# Patient Record
Sex: Female | Born: 1955 | Race: White | Hispanic: No | Marital: Married | Smoking: Former smoker
Health system: Southern US, Community
[De-identification: ages and names within clinical notes are randomized; demographics above are authoritative.]

## PROBLEM LIST (undated history)

## (undated) DIAGNOSIS — I341 Nonrheumatic mitral (valve) prolapse: Secondary | ICD-10-CM

## (undated) DIAGNOSIS — R002 Palpitations: Secondary | ICD-10-CM

## (undated) DIAGNOSIS — R2689 Other abnormalities of gait and mobility: Secondary | ICD-10-CM

## (undated) DIAGNOSIS — R221 Localized swelling, mass and lump, neck: Secondary | ICD-10-CM

## (undated) DIAGNOSIS — K219 Gastro-esophageal reflux disease without esophagitis: Secondary | ICD-10-CM

## (undated) DIAGNOSIS — M545 Low back pain, unspecified: Secondary | ICD-10-CM

## (undated) DIAGNOSIS — IMO0002 Reserved for concepts with insufficient information to code with codable children: Secondary | ICD-10-CM

## (undated) DIAGNOSIS — R251 Tremor, unspecified: Secondary | ICD-10-CM

## (undated) DIAGNOSIS — I10 Essential (primary) hypertension: Secondary | ICD-10-CM

## (undated) DIAGNOSIS — M62838 Other muscle spasm: Secondary | ICD-10-CM

## (undated) DIAGNOSIS — M810 Age-related osteoporosis without current pathological fracture: Secondary | ICD-10-CM

## (undated) DIAGNOSIS — F419 Anxiety disorder, unspecified: Secondary | ICD-10-CM

## (undated) DIAGNOSIS — R42 Dizziness and giddiness: Secondary | ICD-10-CM

## (undated) HISTORY — DX: Nonrheumatic mitral (valve) prolapse: I34.1

## (undated) HISTORY — DX: Tremor, unspecified: R25.1

## (undated) HISTORY — DX: Age-related osteoporosis without current pathological fracture: M81.0

## (undated) HISTORY — DX: Gastro-esophageal reflux disease without esophagitis: K21.9

## (undated) HISTORY — DX: Essential (primary) hypertension: I10

## (undated) HISTORY — PX: NASAL SINUS SURGERY: SHX719

## (undated) HISTORY — PX: COLONOSCOPY: SHX174

## (undated) HISTORY — DX: Dizziness and giddiness: R42

## (undated) HISTORY — DX: Palpitations: R00.2

## (undated) HISTORY — DX: Reserved for concepts with insufficient information to code with codable children: IMO0002

## (undated) HISTORY — DX: Low back pain, unspecified: M54.50

## (undated) HISTORY — PX: GANGLION CYST EXCISION: SHX1691

## (undated) HISTORY — DX: Anxiety disorder, unspecified: F41.9

## (undated) HISTORY — DX: Other abnormalities of gait and mobility: R26.89

## (undated) HISTORY — DX: Other muscle spasm: M62.838

---

## 1998-05-03 ENCOUNTER — Other Ambulatory Visit: Admission: RE | Admit: 1998-05-03 | Discharge: 1998-05-03 | Payer: Self-pay | Admitting: Gynecology

## 2000-10-20 ENCOUNTER — Emergency Department (HOSPITAL_COMMUNITY): Admission: EM | Admit: 2000-10-20 | Discharge: 2000-10-21 | Payer: Self-pay | Admitting: Emergency Medicine

## 2002-06-15 ENCOUNTER — Other Ambulatory Visit: Admission: RE | Admit: 2002-06-15 | Discharge: 2002-06-15 | Payer: Self-pay | Admitting: Gynecology

## 2003-06-25 ENCOUNTER — Other Ambulatory Visit: Admission: RE | Admit: 2003-06-25 | Discharge: 2003-06-25 | Payer: Self-pay | Admitting: Gynecology

## 2004-07-15 ENCOUNTER — Other Ambulatory Visit: Admission: RE | Admit: 2004-07-15 | Discharge: 2004-07-15 | Payer: Self-pay | Admitting: Gynecology

## 2005-07-17 ENCOUNTER — Other Ambulatory Visit: Admission: RE | Admit: 2005-07-17 | Discharge: 2005-07-17 | Payer: Self-pay | Admitting: Gynecology

## 2005-08-07 ENCOUNTER — Emergency Department (HOSPITAL_COMMUNITY): Admission: EM | Admit: 2005-08-07 | Discharge: 2005-08-07 | Payer: Self-pay | Admitting: Emergency Medicine

## 2006-07-19 ENCOUNTER — Other Ambulatory Visit: Admission: RE | Admit: 2006-07-19 | Discharge: 2006-07-19 | Payer: Self-pay | Admitting: Gynecology

## 2007-04-15 ENCOUNTER — Inpatient Hospital Stay (HOSPITAL_COMMUNITY): Admission: EM | Admit: 2007-04-15 | Discharge: 2007-04-16 | Payer: Self-pay | Admitting: Emergency Medicine

## 2007-07-21 ENCOUNTER — Other Ambulatory Visit: Admission: RE | Admit: 2007-07-21 | Discharge: 2007-07-21 | Payer: Self-pay | Admitting: Gynecology

## 2008-02-21 ENCOUNTER — Ambulatory Visit: Payer: Self-pay | Admitting: Gynecology

## 2008-07-31 ENCOUNTER — Encounter: Payer: Self-pay | Admitting: Gynecology

## 2008-07-31 ENCOUNTER — Other Ambulatory Visit: Admission: RE | Admit: 2008-07-31 | Discharge: 2008-07-31 | Payer: Self-pay | Admitting: Gynecology

## 2008-07-31 ENCOUNTER — Ambulatory Visit: Payer: Self-pay | Admitting: Gynecology

## 2008-09-14 ENCOUNTER — Ambulatory Visit: Payer: Self-pay | Admitting: Gynecology

## 2009-04-02 ENCOUNTER — Encounter: Admission: RE | Admit: 2009-04-02 | Discharge: 2009-04-02 | Payer: Self-pay | Admitting: Neurology

## 2009-06-20 ENCOUNTER — Encounter: Admission: RE | Admit: 2009-06-20 | Discharge: 2009-06-20 | Payer: Self-pay | Admitting: Family Medicine

## 2009-08-01 ENCOUNTER — Other Ambulatory Visit: Admission: RE | Admit: 2009-08-01 | Discharge: 2009-08-01 | Payer: Self-pay | Admitting: Gynecology

## 2009-08-01 ENCOUNTER — Ambulatory Visit: Payer: Self-pay | Admitting: Gynecology

## 2009-10-23 ENCOUNTER — Ambulatory Visit: Payer: Self-pay | Admitting: Gynecology

## 2009-11-18 ENCOUNTER — Ambulatory Visit: Payer: Self-pay | Admitting: Gynecology

## 2009-12-04 ENCOUNTER — Ambulatory Visit: Payer: Self-pay | Admitting: Gynecology

## 2010-08-04 ENCOUNTER — Encounter (INDEPENDENT_AMBULATORY_CARE_PROVIDER_SITE_OTHER): Payer: Managed Care, Other (non HMO) | Admitting: Gynecology

## 2010-08-04 ENCOUNTER — Other Ambulatory Visit: Payer: Self-pay | Admitting: Gynecology

## 2010-08-04 ENCOUNTER — Other Ambulatory Visit (HOSPITAL_COMMUNITY)
Admission: RE | Admit: 2010-08-04 | Discharge: 2010-08-04 | Disposition: A | Discharge status: Home / Self Care | Payer: Managed Care, Other (non HMO) | Source: Ambulatory Visit | Attending: Gynecology | Admitting: Gynecology

## 2010-08-04 DIAGNOSIS — Z124 Encounter for screening for malignant neoplasm of cervix: Secondary | ICD-10-CM | POA: Insufficient documentation

## 2010-08-04 DIAGNOSIS — B3731 Acute candidiasis of vulva and vagina: Secondary | ICD-10-CM

## 2010-08-04 DIAGNOSIS — Z01419 Encounter for gynecological examination (general) (routine) without abnormal findings: Secondary | ICD-10-CM

## 2010-08-04 DIAGNOSIS — B373 Candidiasis of vulva and vagina: Secondary | ICD-10-CM

## 2010-09-05 ENCOUNTER — Ambulatory Visit: Payer: Managed Care, Other (non HMO) | Admitting: Gynecology

## 2010-09-05 ENCOUNTER — Ambulatory Visit (INDEPENDENT_AMBULATORY_CARE_PROVIDER_SITE_OTHER): Payer: Managed Care, Other (non HMO) | Admitting: Gynecology

## 2010-09-05 DIAGNOSIS — B373 Candidiasis of vulva and vagina: Secondary | ICD-10-CM

## 2010-09-05 DIAGNOSIS — L293 Anogenital pruritus, unspecified: Secondary | ICD-10-CM

## 2010-09-05 DIAGNOSIS — N898 Other specified noninflammatory disorders of vagina: Secondary | ICD-10-CM

## 2010-09-05 DIAGNOSIS — N9089 Other specified noninflammatory disorders of vulva and perineum: Secondary | ICD-10-CM

## 2010-10-07 NOTE — H&P (Signed)
Sandra Morales, Sandra Morales               ACCOUNT NO.:  [id number]   MEDICAL RECORD NO.:  [id number]          PATIENT TYPE:  EMS   LOCATION:  MAJO                         FACILITY:  [facility]   PHYSICIAN:  Michelene Gardener, MD    DATE OF BIRTH:  [birth year]   DATE OF ADMISSION:  04/15/2007  DATE OF DISCHARGE:                              HISTORY & PHYSICAL   PRIMARY CARE PHYSICIAN:  Dr. Sigmund Hazel.   CHIEF COMPLAINT:  Increasing chest pain.   HISTORY OF PRESENT ILLNESS:  This is a 55 year old Caucasian female with  a past medical history of muscular spasm, presented with the above-  mentioned complaint.  The patient woke up around 4:00 in the morning  complaining of left-sided chest pain.  The patient was described as a  spasm and sometime feels pressure like.  It was around 7 to 8/10 with  some radiation to her left side of the neck.  Also complaining of  numbness over her left hand.  She was also having sweating involving her  bilateral hands and bilateral lower extremities.  Had nausea, but denied  vomiting.  She denied shortness of breath.  She had a throbbing headache  as well that started at the same time.  When she came to the ER, her  symptoms improved, but are still present.  She continued to have the  chest pain on and off and it became more like a spasm and a driving kind  of pain.  The patient admitted to having muscular spasm this fall  involving in her neck muscles and that was treated by her primary care  physician with Flexeril.  She had good results on those.  This is the  first time she experienced chest pain and did not respond to Flexeril  that she took.   PAST MEDICAL HISTORY:  Significant for muscular spasm.   MEDICATIONS:  Flexeril 10 mg q.8 hours as needed.   ALLERGIES:  TO:  1. FLAGYL.  2. PENICILLIN.  3. SULFA.  4. CODEINE.   PAST SURGICAL HISTORY:  Denied.   SOCIAL HISTORY:  The patient denied smoking.  She used to smoke a few  cigarettes when she was in  her high school and that is more than 30  years ago.  She drinks alcohol occasionally and she denies recreational  drugs.   FAMILY HISTORY:  Her father had history of hypertension and  hyperlipidemia.  Mother had history of hypertension and hyperlipidemia.  Grandfather, from mother's side, had coronary artery disease, which he  developed in his 75s and her grandmother, from her mother's side, also  had coronary artery disease and old age, in addition to heart failure.   REVIEW OF SYSTEMS:  CONSTITUTIONAL:  Positive for headache and  fatiguability.  There is no fever.  EYES:  There is no blurred vision.  ENT:  There is no problems with hearing.  There is no tinnitus.  CARDIOVASCULAR:  Postoperative for chest pain with radiation to her left  arm.  There is no shortness of breath and there is no palpitations.  No  syncope.  RESPIRATORY:  No cough.  No wheezes.  GI:  Positive for  nausea.  There is no vomiting.  There is no abdominal pain and there is  no diarrhea.  GU:  No dysuria or hematuria.  ENDOCRINE:  No polyuria.  No nocturia.  HEMATOLOGY:  No bruises.  No bleeding.  ID:  No rash.  No  lesions.  NEURO:  There is numbness in the left hand.  There is no  weakness.  There is no blurred vision.  No slurred speech.  SKIN:  There  is no rash.  The rest of systems were reviewed and they were negative.   PHYSICAL EXAMINATION:  VITAL SIGNS:  Upon arrival to the ER, her  temperature is 97.8, blood pressure is 146/79, pulse 84, respiratory  rate is 16.  GENERAL APPEARANCE:  This is a middle-aged Caucasian female, not in  acute distress.  HEENT:  Her conjunctivae are pink.  Pupils are equal and reactive to  light.  Hearing is intact.  There is no ear discharge or infection.  There is no nose infection or bleeding.  Oral mucosa is moist.  There is  no pharyngeal erythema.  NECK:  Supple.  There is no JVD.  No carotid bruits.  No  lymphadenopathy.  No thyroid enlargement or thyroid  tenderness.  CARDIOVASCULAR EXAMINATION:  S1 and S2 are regular.  There is no  murmurs.  Pulses are intact bilaterally in her radial pulse and is  regular.  RESPIRATORY EXAMINATION:  Upon inspection, the patient is breathing  between 16 to 18.  There is no use of accessory muscles.  Clear to  auscultation.  Showed no rales, no rhonchi and no wheezes.  ABDOMINAL EXAMINATION:  The abdomen is soft, nondistended, nontender.  No hepatosplenomegaly.  Bowel sounds are normal.  LOWER EXTREMITIES:  No edema.  No rash.  No varicose veins.  Pedal  pulses and tibial pulses are present and regular.  NEURO EXAM:  Just gross examination showed no neurological deficits.  Her cranial nerves are intact from II-XII.  Motor seems to be intact and  its strength is 5/5 in all 4 extremities.  Reflexes are intact.  Her  sensation is grossly intact to pain and touch sensation.   LABORATORY DATA:  WBC 5.3, hemoglobin 12.8, hematocrit 37.0, MCV 83.8,  platelets is 228.  CK-MB less than 1, troponin less than 0.05.   CT scan of the head, without contrast, showed no evidence of acute  problem.  EKG showed normal sinus rhythm with no evidence of ischemia.  There is no ST segment abnormalities and there is no T wave inversions.   ASSESSMENT/PLAN:  Chest pain.  This patient's presentation is mostly  consistent with chest pain secondary to muscular pain.  This patient is  known for muscular spasm, she got that on and off and normally relieved  with Flexeril.  On this presentation, this is the first time she had  this pain in her chest with that associated with some radiation to her  left neck and left arm.  I will observe her for 24 hours.  We will start  her on aspirin and a small dose of her metoprolol.  I will hold on ACE  inhibitors for now.  We will also put her on subcutaneous Lovenox.  I  will get cardiology to see her and to stratify her risk factors for  further evaluation for possible stress test.   Meanwhile, we will monitor  her in telemetry for any possible arrhythmias.  DISPOSITION:  Other medical problems seem to be stable at this present  time.  We will continue her outpatient medication, which is Flexeril as  needed.   Total assessment time is 50 minutes.      Michelene Gardener, MD  Electronically Signed     NAE/MEDQ  D:  04/15/2007  T:  04/15/2007  Job:  [id number]   cc:   Sigmund Hazel, M.D.

## 2010-10-07 NOTE — Consult Note (Signed)
Sandra Morales, Sandra Morales               ACCOUNT NO.:  [id number]   MEDICAL RECORD NO.:  [id number]          PATIENT TYPE:  INP   LOCATION:  3708                         FACILITY:  [facility]   PHYSICIAN:  Jake Bathe, MD      DATE OF BIRTH:  1956-05-16   DATE OF CONSULTATION:  04/15/2007  DATE OF DISCHARGE:                                 CONSULTATION   REASON FOR CONSULTATION:  Evaluation of chest pain.   HISTORY OF PRESENT ILLNESS:  A 55 year old perimenopausal female with no  prior cardiac history, but a history of migraines and muscle spasms, who  woke up this morning with chest pain.  This pain was waxing and waning  in intensity, described as a squeezing off and on, lasting a few seconds  at a time, but disconcerting associated with no shortness of breath or  diaphoresis or nausea.  She had some radiation to her left shoulder.  No  dizziness.  She has experienced muscle spasms in the past and she takes  Flexeril occasionally, but this was different and prompted her to go to  [facility] Emergency Department for further evaluation.  Currently, she  is chest pain free, she did not receive a nitroglycerin.  She is  accompanied by her husband.  Her palms and soles of feet were noticeably  sweaty this morning and she did have some chills and left arm tingling  earlier.  Otherwise, no history of syncope, orthopnea, PND, edema, or  bleeding.  Perimenopausal.   PAST MEDICAL HISTORY:  1. Muscle spasms.  2. Migraines.  3. Murmur/click diagnosed as a Archivist.  4. History of palpitations with caffeine where a Holter monitor just      showed PVCs.   PAST SURGICAL HISTORY:  She did have a cyst removed from her wrist.   ALLERGIES:  FLAGYL, PENICILLIN, SULFA, and CODEINE.   MEDICATIONS:  She takes Flexeril p.r.n.  She is not on aspirin.   SOCIAL HISTORY:  She denies any tobacco use or rare alcohol use.  No  illicit drug use.  She works as a Print production planner for a [employer].   FAMILY HISTORY:  No early family history of coronary artery disease or  sudden death.   REVIEW OF SYSTEMS:  Unless explained above, all other 12 review of  systems are negative.   PHYSICAL EXAMINATION:  VITAL SIGNS:  Blood pressure 146/79 to 136/71,  pulse 82, respirations 16, saturating 98% on room air, and temperature  97.8.  GENERAL:  Alert and oriented x3 and mildly anxious in bed.  Here with  her husband.  EYES:  Well perfused conjunctiva.  EOMI.  No scleral icterus.  HEENT:  Moist mucous membranes.  Supple neck.  No thyromegaly.  No  carotid bruits.  No JVD.  CARDIOVASCULAR:  Regular rate and rhythm with no ectopy.  No murmurs,  rubs, or gallops.  Normal PMI.  LUNGS:  Clear to auscultation bilaterally.  Normal respiratory effort.  ABDOMEN:  Soft, nontender, normoactive bowel sounds.  No bruits.  EXTREMITIES:  No clubbing, cyanosis, or edema.  Normal  pulses distally.  NEUROLOGIC:  Nonfocal.  Unable to assess gait.  SKIN:  Warm, dry, and intact.  No rashes.   LABORATORY DATA:  ECG obtained demonstrated a sinus rhythm rate 88 with  no other obvious abnormalities.  Intervals are normal.  White count 5.3,  hemoglobin 12.8, hematocrit 37.0, platelet count 228,000, and creatinine  0.6.  First set of cardiac enzymes are normal.  Chest x-ray personally  viewed demonstrates some mild thickening of the proximal airways.  Other  than that no acute issues.   ASSESSMENT AND PLAN:  A 55 year old female with prior history of  migraines and muscle spasms, here with atypical chest pain, possible  acute coronary syndrome.  Chest pain - We will monitor on telemetry, on aspirin.  We will place on  low-dose beta-blocker.  The story is quite atypical.  We will hold off  on Lovenox or heparin at this time unless enzymes are positive.  If  cardiac biomarkers and ECG remain normal, we will likely discharge the  patient and perform stress test evaluation soon in clinic.  Likely other   possible etiology to include muscle spasm, as she has had prior.  Also,  gastroesophageal reflux disease may be a possibility.  May try Protonix.  Place on low-dose aspirin.  We will check fasting lipid profile and  thyroid panel and continue to cycle cardiac enzymes.  We will closely  follow with you.  We would likely discharge in a.m. following a complete  set of negative cardiac enzymes.      Jake Bathe, MD  Electronically Signed     MCS/MEDQ  D:  04/15/2007  T:  04/16/2007  Job:  [id number]

## 2010-10-07 NOTE — Discharge Summary (Signed)
NAMEANALYSE, ANGST               ACCOUNT NO.:  [id number]   MEDICAL RECORD NO.:  [id number]          PATIENT TYPE:  INP   LOCATION:  3708                         FACILITY:  [facility]   PHYSICIAN:  Michelene Gardener, MD    DATE OF BIRTH:  November 06, 1955   DATE OF ADMISSION:  04/15/2007  DATE OF DISCHARGE:  04/16/2007                               DISCHARGE SUMMARY   PRIMARY CARE PHYSICIAN:  Sigmund Hazel, M.D.   DISCHARGE DIAGNOSES:  1. Chest pain, most likely musculoskeletal secondary to muscular      spasm.  2. Anxiety.   DISCHARGE MEDICATIONS:  1. Aspirin 81 mg p.o. once daily.  2. Metoprolol 12.5 mg p.o. twice daily.  3. Flexeril 10 mg q.8h. as needed.   CONSULTATIONS:  Cardiology consult.   PROCEDURES:  None.   FOLLOWUP APPOINTMENTS:  The patient is scheduled to have Cardiolite  stress test next Wednesday with Dr. Verdis Prime.  Followup appointment  with primary physician and with cardiology for her Cardiolite stress  test.   HOSPITAL COURSE:  This is a 55 year old female who presented to the  hospital complaining of chest pain.  This patient was admitted to the  hospital for further evaluation.  She was monitored on telemetry.  Three  sets of troponin and cardiac enzymes were done, and they came to be  normal.  Her EKG showed no evidence of acute ischemia.  This patient has  a history of muscular spasm, and it is felt that her pain secondary to  this muscular spasm.  The patient was evaluated by cardiology who  recommended to do outpatient stress test next Wednesday and to keep the  patient on aspirin and Lopressor until the final results of the stress  test.  The patient was also given Flexeril to be taken as needed.   ASSESSMENT TIME:  40 minutes.  Thank you so much.      Michelene Gardener, MD  Electronically Signed     NAE/MEDQ  D:  04/16/2007  T:  04/17/2007  Job:  [id number]   cc:   Sigmund Hazel, M.D.

## 2011-03-03 LAB — DIFFERENTIAL
Basophils Relative: 1
Eosinophils Absolute: 0.2
Eosinophils Relative: 3
Lymphs Abs: 2.3
Neutrophils Relative %: 45

## 2011-03-03 LAB — I-STAT 8, (EC8 V) (CONVERTED LAB)
BUN: 13
Chloride: 103
pCO2, Ven: 48
pH, Ven: 7.363 — ABNORMAL HIGH

## 2011-03-03 LAB — CK TOTAL AND CKMB (NOT AT ARMC)
CK, MB: 0.7
CK, MB: 0.7
Total CK: 48
Total CK: 50
Total CK: 57

## 2011-03-03 LAB — CBC
HCT: 37
MCHC: 34.5
Platelets: 228
RDW: 13.7

## 2011-03-03 LAB — LIPID PANEL
Cholesterol: 133
HDL: 46
Triglycerides: 31
VLDL: 6

## 2011-03-03 LAB — POCT CARDIAC MARKERS
CKMB, poc: <1 — ABNORMAL LOW
Myoglobin, poc: 57
Operator id: 270651

## 2011-04-30 ENCOUNTER — Encounter: Payer: Self-pay | Admitting: *Deleted

## 2011-04-30 ENCOUNTER — Emergency Department
Admission: EM | Admit: 2011-04-30 | Discharge: 2011-04-30 | Disposition: A | Discharge status: Home / Self Care | Payer: Managed Care, Other (non HMO) | Source: Home / Self Care | Attending: Emergency Medicine | Admitting: Emergency Medicine

## 2011-04-30 DIAGNOSIS — J069 Acute upper respiratory infection, unspecified: Secondary | ICD-10-CM

## 2011-04-30 HISTORY — DX: Localized swelling, mass and lump, neck: R22.1

## 2011-04-30 MED ORDER — FLUTICASONE PROPIONATE 50 MCG/ACT NA SUSP: NASAL | Status: DC

## 2011-04-30 MED ORDER — AZITHROMYCIN 250 MG PO TABS: ORAL_TABLET | ORAL | Status: AC

## 2011-04-30 NOTE — ED Provider Notes (Signed)
History     CSN: [id number] Arrival date & time: 04/30/2011  1:32 PM   First MD Initiated Contact with Patient 04/30/11 1331      Chief Complaint  Patient presents with  . Fatigue    (Consider location/radiation/quality/duration/timing/severity/associated sxs/prior treatment) HPI Comments: 5 days ago, had acute onset of loose watery stools x9 with fatigue and some myalgias. Later that day he was seen at the [facility] walk[facility]in clinic where she was diagnosed with viral gastroenteritis, given symptomatic care advice such as Pepto-Bismol and Imodium. These measures helped and her GI symptoms have now resolved. Fever and myalgias have resolved. Now complains of 2 days of sinus congestion, low-grade fever, slight discolored rhinorrhea with some ear pressure nasal stuffiness. Denies sore throat chest pain dyspnea or any productive cough. She feels fatigued but is able to tolerate by mouth clear liquids although her appetite is not back to normal yet.  The history is provided by the patient.    No past medical history on file.  No past surgical history on file.  No family history on file.  History  Substance Use Topics  . Smoking status: Not on file  . Smokeless tobacco: Not on file  . Alcohol Use: Not on file    OB History    No data available      Review of Systems  Constitutional: Positive for appetite change and fatigue.  HENT: Negative for nosebleeds and neck pain.   Eyes: Negative.  Negative for pain.  Respiratory: Negative for chest tightness, shortness of breath and wheezing.   Cardiovascular: Negative for chest pain and leg swelling.  Gastrointestinal: Negative for abdominal distention.  Genitourinary: Negative.   Musculoskeletal: Negative.   Neurological: Positive for light-headedness (Had this couple days ago but it resolved.). Negative for seizures, syncope and weakness.  Hematological: Negative.   Psychiatric/Behavioral: Negative.     Allergies  Codeine; Flagyl;  and Sulphadimidine sodium  Home Medications   Current Outpatient Rx  Name Route Sig Dispense Refill  . AZITHROMYCIN 250 MG PO TABS  Take 2 tablets on day one, then 1 tablet daily on days 2 through 5 1 each 0  . FLUTICASONE PROPIONATE 50 MCG/ACT NA SUSP  1 or 2 sprays each nostril twice a day 16 g 0    There were no vitals taken for this visit.  Physical Exam  Nursing note and vitals reviewed. Constitutional: She is oriented to person, place, and time. She appears well-developed and well-nourished. No distress.  HENT:  Head: Normocephalic and atraumatic.  Right Ear: Tympanic membrane, external ear and ear canal normal.  Left Ear: Tympanic membrane, external ear and ear canal normal.  Nose: Mucosal edema and rhinorrhea present. Right sinus exhibits maxillary sinus tenderness (Minimal). Left sinus exhibits maxillary sinus tenderness (Minimal).  Mouth/Throat: Oropharynx is clear and moist. No oral lesions. No oropharyngeal exudate.  Eyes: Right eye exhibits no discharge. Left eye exhibits no discharge. No scleral icterus.  Neck: Neck supple.  Cardiovascular: Normal rate, regular rhythm and normal heart sounds.   Pulmonary/Chest: Effort normal and breath sounds normal. She has no wheezes. She has no rales.  Lymphadenopathy:    She has no cervical adenopathy.  Neurological: She is alert and oriented to person, place, and time.  Skin: Skin is warm and dry.   she appears fatigued, but not toxic  ED Course  Procedures (including critical care time)  Labs Reviewed - No data to display No results found.   1. URI (upper respiratory infection)  MDM  Likely has a viral upper respiratory infection. It's possible that she could have had influenza 5 days ago, with some GI symptoms but those symptoms have resolved. We discussed testing options and she agrees not to do flu test as that would not change management even if she were to be positive. At her request I wrote for prescription  for Zithromax Z-Pak, but explained not to fill this unless she develops bacterial URI symptoms such as a thick purulent sputum or nasal discharge or spiking fever. She agrees. Prescribed Flonase for nasal congestion as that has helped in the past. And she does not tolerate decongestants well. She voiced understanding and agreement.        Lonell Face, MD 04/30/11 [phone]

## 2011-07-30 DIAGNOSIS — I341 Nonrheumatic mitral (valve) prolapse: Secondary | ICD-10-CM | POA: Insufficient documentation

## 2011-08-11 ENCOUNTER — Encounter: Payer: Self-pay | Admitting: Gynecology

## 2011-08-12 ENCOUNTER — Other Ambulatory Visit: Payer: Self-pay | Admitting: *Deleted

## 2011-08-12 DIAGNOSIS — N6459 Other signs and symptoms in breast: Secondary | ICD-10-CM

## 2011-08-13 ENCOUNTER — Other Ambulatory Visit: Payer: Self-pay | Admitting: Gynecology

## 2011-08-13 DIAGNOSIS — N6459 Other signs and symptoms in breast: Secondary | ICD-10-CM

## 2011-08-14 ENCOUNTER — Encounter: Payer: Self-pay | Admitting: Gynecology

## 2011-08-14 ENCOUNTER — Ambulatory Visit (INDEPENDENT_AMBULATORY_CARE_PROVIDER_SITE_OTHER): Payer: Managed Care, Other (non HMO) | Admitting: Gynecology

## 2011-08-14 VITALS — BP 144/70 | Ht 64.75 in | Wt 155.0 lb

## 2011-08-14 DIAGNOSIS — N949 Unspecified condition associated with female genital organs and menstrual cycle: Secondary | ICD-10-CM

## 2011-08-14 DIAGNOSIS — Z131 Encounter for screening for diabetes mellitus: Secondary | ICD-10-CM

## 2011-08-14 DIAGNOSIS — M8580 Other specified disorders of bone density and structure, unspecified site: Secondary | ICD-10-CM

## 2011-08-14 DIAGNOSIS — Z13220 Encounter for screening for lipoid disorders: Secondary | ICD-10-CM

## 2011-08-14 DIAGNOSIS — R635 Abnormal weight gain: Secondary | ICD-10-CM

## 2011-08-14 DIAGNOSIS — N760 Acute vaginitis: Secondary | ICD-10-CM

## 2011-08-14 DIAGNOSIS — A499 Bacterial infection, unspecified: Secondary | ICD-10-CM

## 2011-08-14 DIAGNOSIS — Z01419 Encounter for gynecological examination (general) (routine) without abnormal findings: Secondary | ICD-10-CM

## 2011-08-14 DIAGNOSIS — N898 Other specified noninflammatory disorders of vagina: Secondary | ICD-10-CM

## 2011-08-14 DIAGNOSIS — B9689 Other specified bacterial agents as the cause of diseases classified elsewhere: Secondary | ICD-10-CM

## 2011-08-14 DIAGNOSIS — M899 Disorder of bone, unspecified: Secondary | ICD-10-CM

## 2011-08-14 LAB — WET PREP FOR TRICH, YEAST, CLUE: Yeast Wet Prep HPF POC: NONE SEEN

## 2011-08-14 MED ORDER — PROGESTERONE MICRONIZED 100 MG PO CAPS: 100.0000 mg | ORAL_CAPSULE | Freq: Every day | ORAL | Status: DC

## 2011-08-14 MED ORDER — ESTRADIOL 0.05 MG/24HR TD PTTW: 1.0000 | MEDICATED_PATCH | TRANSDERMAL | Status: DC

## 2011-08-14 MED ORDER — CLINDAMYCIN PHOSPHATE 2 % VA CREA: 1.0000 | TOPICAL_CREAM | Freq: Every day | VAGINAL | Status: AC

## 2011-08-14 NOTE — Progress Notes (Signed)
Sandra Morales February 15, 1956 [id number]        56 y.o.  for annual exam.  Several issues noted below  Past medical history,surgical history, medications, allergies, family history and social history were all reviewed and documented in the EPIC chart. ROS:  Was performed and pertinent positives and negatives are included in the history.  Exam: Sherrilyn Rist chaperone present Filed Vitals:   08/14/11 1547  BP: 144/70   General appearance  Normal Skin grossly normal Head/Neck normal with no cervical or supraclavicular adenopathy thyroid normal Lungs  clear Cardiac RR, without RMG Abdominal  soft, nontender, without masses, organomegaly or hernia Breasts  examined lying and sitting without masses, retractions, discharge or axillary adenopathy. Pelvic  Ext/BUS/vagina  normal with white discharge  Cervix  normal    Uterus  axial, normal size, shape and contour, midline and mobile nontender   Adnexa  Without masses or tenderness    Anus and perineum  normal   Rectovaginal  normal sphincter tone without palpated masses or tenderness.    Assessment/Plan:  56 y.o. female for annual exam.    1. Vaginal odor. Patient complaining of a vaginal odor she does have a whitish discharge.  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 We'll treat with Cleocin vaginal cream nightly x1 week as she is allergic to Flagyl. Follow up if symptoms persist or recur. 2. Menopausal symptoms. Patient continues to have significant hot flushes and night sweats.  A friend of hers is using a compounded skin cream and she had questions about this. I reviewed the whole issue of pharmaceutical versus compounded HRT. I reviewed the WHI study with her to include the increased risk of stroke heart attack DVT and increased risk of breast cancer. She had tried CombiPatch several years ago apparently had some heart racing that she stopped the medication after being on a for a short period of time.  Alternatives such as soy based for fracture or  have been reviewed and ultimately the patient wants to try HRT again accepted the risks. I prescribed minivelle 0.05 mg patches and Prometrium 100 mg nightly and we'll see how she does with this. 3. Mammography. Patient recently had mammogram with follow up ultrasound showed some small cysts in the left breast. They recommended annual follow up. SBE monthly reviewed.  4. Osteopenia. DEXA June 2011 showed osteopenia with a T score -1.7. Repeat DEXA ordered now. Baseline vitamin D ordered. Increase calcium vitamin D reviewed. 5. Pap smear. The Pap smear was done today. Last Pap smear 2011. She has no history of significant abnormal Pap smears in the past with multiple normal reports in the chart. Her review current screening guidelines we'll plan on every three-year Pap smears. 6. Colonoscopy. Patient is up-to-date with colonoscopy with her last one in May 2011. 7. Elevated blood pressure.  Patient's blood pressure is 144/70. Apparently had been elevated at her neurologist's office also. She is not currently exercising I recommended that she really activate her exercise program follow her blood pressure and if it does remain elevated she will need to see her primary Dr. Sigmund Hazel in follow up and consider treatment.  She had been on medication intermittently for some swelling but does not take that now I discussed with her that if her blood pressure remains elevated she would need to consider alternative medication. 8. Health maintenance. Baseline CBC, compressive metabolic panel, vitamin D, lipid profile and urinalysis were ordered as she knows she has not seen Dr. Hyacinth Meeker in some time and  had not had any blood work done. Patient will follow up with me after initiating HRT and we'll see how she does.    Dara Lords MD, 5:26 PM 08/14/2011

## 2011-08-14 NOTE — Patient Instructions (Signed)
Initiate hormone replacement therapy as we discussed. Follow up with me if you have any issues or questions. Use vaginal cream follow up if symptoms persist. Continue to monitor blood pressure. It remains elevated he'll need to see Dr. Sigmund Hazel in follow up.

## 2011-08-15 LAB — URINALYSIS W MICROSCOPIC + REFLEX CULTURE
Bacteria, UA: NONE SEEN
Bilirubin Urine: NEGATIVE
Casts: NONE SEEN
Crystals: NONE SEEN
Glucose, UA: NEGATIVE mg/dL
Ketones, ur: NEGATIVE mg/dL
Nitrite: NEGATIVE
Protein, ur: NEGATIVE mg/dL
Specific Gravity, Urine: 1.015 (ref 1.005–1.030)
Squamous Epithelial / HPF: NONE SEEN
Urobilinogen, UA: 1 mg/dL (ref 0.0–1.0)
pH: 6 (ref 5.0–8.0)

## 2011-08-15 LAB — CBC WITH DIFFERENTIAL/PLATELET
HCT: 38.9 % (ref 36.0–46.0)
Hemoglobin: 12.8 g/dL (ref 12.0–15.0)
Lymphocytes Relative: 31 % (ref 12–46)
Monocytes Absolute: 0.5 10*3/uL (ref 0.1–1.0)
Monocytes Relative: 7 % (ref 3–12)
Neutro Abs: 4.3 10*3/uL (ref 1.7–7.7)
RBC: 4.53 MIL/uL (ref 3.87–5.11)
WBC: 7.3 10*3/uL (ref 4.0–10.5)

## 2011-08-15 LAB — VITAMIN D 25 HYDROXY (VIT D DEFICIENCY, FRACTURES): Vit D, 25-Hydroxy: 19 ng/mL — ABNORMAL LOW (ref 30–89)

## 2011-08-15 LAB — COMPREHENSIVE METABOLIC PANEL
AST: 19 U/L (ref 0–37)
Albumin: 4.4 g/dL (ref 3.5–5.2)
BUN: 12 mg/dL (ref 6–23)
Calcium: 9.1 mg/dL (ref 8.4–10.5)
Chloride: 106 mEq/L (ref 96–112)
Glucose, Bld: 96 mg/dL (ref 70–99)
Potassium: 3.7 mEq/L (ref 3.5–5.3)

## 2011-08-15 LAB — LIPID PANEL
HDL: 49 mg/dL (ref >39)
Triglycerides: 48 mg/dL (ref <150)

## 2011-08-16 LAB — URINE CULTURE: Colony Count: 75000

## 2011-08-17 NOTE — Progress Notes (Signed)
Addended by: Dara Lords on: 08/17/2011 08:10 AM   Modules accepted: Orders

## 2011-08-26 ENCOUNTER — Telehealth: Payer: Self-pay | Admitting: *Deleted

## 2011-08-26 NOTE — Telephone Encounter (Signed)
Pt called with questions about HRT patch and Prometrium pills all questions answered.

## 2011-09-22 ENCOUNTER — Telehealth: Payer: Self-pay | Admitting: *Deleted

## 2011-09-22 MED ORDER — CLINDAMYCIN HCL 300 MG PO CAPS: 300.0000 mg | ORAL_CAPSULE | Freq: Two times a day (BID) | ORAL | Status: AC

## 2011-09-22 NOTE — Telephone Encounter (Signed)
Pt informed with the below note. 

## 2011-09-22 NOTE — Telephone Encounter (Signed)
Let's try clindamycin 300 mg twice a day x7 days

## 2011-09-22 NOTE — Telephone Encounter (Signed)
Pt is calling c/o very light clear discharge and very light vagina odor. Pt was given Cleocin vaginal cream nightly x1 week on 08/14/11 and took all of rx. Pt doesn't think that the cream cleared the BV infection total. Can pt have rx or OV? Please advise

## 2011-10-05 ENCOUNTER — Encounter: Payer: Self-pay | Admitting: Gynecology

## 2011-10-05 ENCOUNTER — Ambulatory Visit (INDEPENDENT_AMBULATORY_CARE_PROVIDER_SITE_OTHER): Payer: Managed Care, Other (non HMO) | Admitting: Gynecology

## 2011-10-05 DIAGNOSIS — N898 Other specified noninflammatory disorders of vagina: Secondary | ICD-10-CM

## 2011-10-05 DIAGNOSIS — N949 Unspecified condition associated with female genital organs and menstrual cycle: Secondary | ICD-10-CM

## 2011-10-05 DIAGNOSIS — E559 Vitamin D deficiency, unspecified: Secondary | ICD-10-CM

## 2011-10-05 LAB — WET PREP FOR TRICH, YEAST, CLUE: Yeast Wet Prep HPF POC: NONE SEEN

## 2011-10-05 NOTE — Progress Notes (Signed)
Patient presents complaining of some vaginal wetness. She was treated at her annual exam in March for HPV with Cleocin vaginal cream. Note see vaginal odor seem to get better but still has some of discharge we have prescribed Cleocin oral but she never took it. Also has started on black cohosh for menopausal symptoms. She decided against HRT as we had discussed at her annual. Does note a lot of GI activity since starting the black cohosh.  Exam with Sherrilyn Rist chaperone present Abdomen soft nontender without masses guarding rebound organomegaly. Pelvic external BUS vagina with slight white discharge. Cervix normal. Uterus normal size midline mobile nontender. Adnexa without masses or tenderness  Assessment and plan: Exam overall normal. Wet prep is unremarkable except for some bacteria. I discussed the issues of low grade bacterial with we will go ahead and treat with oral Cleocin or not. She's more comfortable just waiting and she's not overtly symptomatic. I did discuss adding soy to see if this doesn't help with her menopausal symptoms and she's going to do so. I also reviewed her need to increase her calcium and vitamin D. Her vitamin D level was 19 and I stressed the need to get started on vitamin D supplement.

## 2011-10-05 NOTE — Patient Instructions (Signed)
Increase vitamin D and calcium as we discussed.

## 2011-11-06 ENCOUNTER — Other Ambulatory Visit: Payer: Self-pay | Admitting: *Deleted

## 2011-11-06 DIAGNOSIS — E7800 Pure hypercholesterolemia, unspecified: Secondary | ICD-10-CM

## 2011-11-12 ENCOUNTER — Other Ambulatory Visit: Payer: Managed Care, Other (non HMO)

## 2011-11-12 ENCOUNTER — Ambulatory Visit (INDEPENDENT_AMBULATORY_CARE_PROVIDER_SITE_OTHER): Payer: Managed Care, Other (non HMO)

## 2011-11-12 ENCOUNTER — Telehealth: Payer: Self-pay | Admitting: *Deleted

## 2011-11-12 DIAGNOSIS — M810 Age-related osteoporosis without current pathological fracture: Secondary | ICD-10-CM

## 2011-11-12 DIAGNOSIS — M8580 Other specified disorders of bone density and structure, unspecified site: Secondary | ICD-10-CM

## 2011-11-12 DIAGNOSIS — E7800 Pure hypercholesterolemia, unspecified: Secondary | ICD-10-CM

## 2011-11-12 LAB — LIPID PANEL
HDL: 44 mg/dL (ref >39)
Total CHOL/HDL Ratio: 3.3 Ratio
Triglycerides: 56 mg/dL (ref <150)

## 2011-11-12 NOTE — Telephone Encounter (Signed)
Pt informed with the below note. 

## 2011-11-12 NOTE — Telephone Encounter (Signed)
Pt is here this am for bone density today and would like to know if she can take OTC super calcium magnesium 800 I/U Vit. D?

## 2011-11-12 NOTE — Telephone Encounter (Signed)
okay

## 2011-11-13 LAB — VITAMIN D 25 HYDROXY (VIT D DEFICIENCY, FRACTURES): Vit D, 25-Hydroxy: 36 ng/mL (ref 30–89)

## 2011-11-17 ENCOUNTER — Encounter: Payer: Self-pay | Admitting: Gynecology

## 2011-11-17 ENCOUNTER — Ambulatory Visit (INDEPENDENT_AMBULATORY_CARE_PROVIDER_SITE_OTHER): Payer: Managed Care, Other (non HMO) | Admitting: Gynecology

## 2011-11-17 DIAGNOSIS — M810 Age-related osteoporosis without current pathological fracture: Secondary | ICD-10-CM

## 2011-11-17 NOTE — Patient Instructions (Signed)
I will call you after discussion in review of your bone density study with other physicians.

## 2011-11-17 NOTE — Progress Notes (Signed)
Patient presents for review of her bone density. Most recently had DEXA that shows T score -2.0 at the right femoral neck -2.8 at the spine considering L3-L4. On review of her spine pictures L1-L2 showed T score -0.2 and -1.6. L3-L4 show -3.3 and -2.4. The right and left hips on comparison from 2008 Baseline DEXA show no change and she has a statistically significant decrease in her AP spine when looking at L3-L4. I reviewed with her the question 2 we accept  L1 and L2 versus L3 and L4.   When all combine total was -2.0. Her vitamin D was 36. She is taking 1000 units daily with some extra calcium. The issue of treatment versus observation reviewed the options for treatment and side effect profiles as well as risks discussed to include bisphosphonates GERD, esophageal cancer, osteonecrosis of the jaw, atypical fractures particularly with prolonged use. Patient is unsure she wants to proceed with treatment versus observation at this point possible short interval follow up study in one year. She wants to think of her options and I also told her that I'm going to review her scans with other physicians to get their opinions and then will have a final discussion. Patient agrees with this plan.

## 2011-11-24 ENCOUNTER — Telehealth: Payer: Self-pay | Admitting: Gynecology

## 2011-11-24 NOTE — Telephone Encounter (Signed)
PT, NOTIFIED OF TF'S NOTE BELOW AND STATES SHE STILL HAS MORE ?'S FOR TF BEFORE STARTING THE RX. THE BEST # TO REACH HER AT IS THE [phone].

## 2011-11-24 NOTE — Telephone Encounter (Addendum)
Tell patient that I discussed bone density with some other physicians and I think that we should go ahead and start treatment. I would recommend starting with a bisphosphonate such as weekly Fosamax or monthly Actonel.  Fosamax is generic which would be cheaper. We discussed the risks and side effects at her office. I think given her younger age that treatment now would be prudent instead of waiting until there could be significant issues. She agrees with this and I will prescribe medication if she has any further questions let me know and I can talk to her.

## 2011-11-30 MED ORDER — ALENDRONATE SODIUM 70 MG PO TABS: 70.0000 mg | ORAL_TABLET | ORAL | Status: DC

## 2011-11-30 NOTE — Telephone Encounter (Signed)
Called patient on the phone. We discussed a recommendation to start a bisphosphonate. I reviewed various options and ultimately we decided on alendronate 70 mg weekly. I reviewed how to take the medication and I discussed again the side effect profile to include GERD, esophageal cancer, osteonecrosis of the jaw and atypical fractures. Patient went start this and we'll plan on repeating her bone density in 2 years.

## 2011-12-04 ENCOUNTER — Encounter: Payer: Self-pay | Admitting: Gynecology

## 2012-04-08 ENCOUNTER — Ambulatory Visit (INDEPENDENT_AMBULATORY_CARE_PROVIDER_SITE_OTHER): Payer: Managed Care, Other (non HMO) | Admitting: Gynecology

## 2012-04-08 ENCOUNTER — Encounter: Payer: Self-pay | Admitting: Gynecology

## 2012-04-08 VITALS — BP 140/80

## 2012-04-08 DIAGNOSIS — R5383 Other fatigue: Secondary | ICD-10-CM

## 2012-04-08 DIAGNOSIS — M2550 Pain in unspecified joint: Secondary | ICD-10-CM

## 2012-04-08 DIAGNOSIS — R609 Edema, unspecified: Secondary | ICD-10-CM

## 2012-04-08 NOTE — Patient Instructions (Signed)
Follow up for lab work. If lab work is all normal and your symptoms resolve after stopping the Fosamax and we'll discuss alternative treatments for your bones. If your symptoms continue then you will need to see your primary physician in follow up for further evaluation. If any of your lab tests are abnormal then will address this individually.

## 2012-04-08 NOTE — Progress Notes (Signed)
Patient presents with a number of complaints to include peripheral extremity swelling and abdominal bloating. Joint pain balled hips and finger joints. Fatigue and weight gain. She's been on Fosamax since June and wonders whether is related to this. She does note the day after taking her Fosamax weekly she has a lot of joint pain muscle weakness which resolves over the day or so.  Is on Estroven for 2 months  Exam HEENT normal without adenopathy Skin without rash or other lesions Lungs clear bilaterally Cardiac regular rate without rubs murmurs or gallops Abdomen soft active bowel sounds without masses guarding tender organomegaly Extremities without overt edema or joint deformity or swelling  Assessment and plan:  Swelling/bloating/fatigue/weight gain/arthralgias. Check baseline CBC comprehensive metabolic panel rheumatoid factor sedimentation rate ANA TSH and urinalysis. Stop Fosamax for now. If symptoms all clear and lab work normal then we'll follow. If symptoms persist she will follow up with her primary physician. We'll reassess after being off the Fosamax. Alternatives excluding bisphosphonates such as Prolia a possibility and we'll rediscuss.

## 2012-04-09 LAB — COMPREHENSIVE METABOLIC PANEL
ALT: 40 U/L — ABNORMAL HIGH (ref 0–35)
AST: 26 U/L (ref 0–37)
Alkaline Phosphatase: 73 U/L (ref 39–117)
BUN: 13 mg/dL (ref 6–23)
Calcium: 9.5 mg/dL (ref 8.4–10.5)
Creat: 0.66 mg/dL (ref 0.50–1.10)
Total Bilirubin: 0.3 mg/dL (ref 0.3–1.2)

## 2012-04-09 LAB — CBC WITH DIFFERENTIAL/PLATELET
Basophils Absolute: 0 10*3/uL (ref 0.0–0.1)
Basophils Relative: 1 % (ref 0–1)
Eosinophils Absolute: 0.2 10*3/uL (ref 0.0–0.7)
Eosinophils Relative: 3 % (ref 0–5)
HCT: 37.7 % (ref 36.0–46.0)
Lymphocytes Relative: 37 % (ref 12–46)
MCHC: 35 g/dL (ref 30.0–36.0)
MCV: 81.1 fL (ref 78.0–100.0)
Monocytes Absolute: 0.5 10*3/uL (ref 0.1–1.0)
Platelets: 238 10*3/uL (ref 150–400)
RDW: 12.3 % (ref 11.5–15.5)
WBC: 7.5 10*3/uL (ref 4.0–10.5)

## 2012-04-09 LAB — URINALYSIS W MICROSCOPIC + REFLEX CULTURE
Bacteria, UA: NONE SEEN
Casts: NONE SEEN
Crystals: NONE SEEN
Glucose, UA: NEGATIVE mg/dL
Hgb urine dipstick: NEGATIVE
Ketones, ur: NEGATIVE mg/dL
Nitrite: NEGATIVE
Specific Gravity, Urine: 1.006 (ref 1.005–1.030)
pH: 6.5 (ref 5.0–8.0)

## 2012-04-09 LAB — RHEUMATOID FACTOR: Rhuematoid fact SerPl-aCnc: <10 IU/mL (ref <=14)

## 2012-04-09 LAB — TSH: TSH: 1.802 u[IU]/mL (ref 0.350–4.500)

## 2012-04-10 LAB — URINE CULTURE

## 2012-07-08 ENCOUNTER — Telehealth: Payer: Self-pay

## 2012-07-08 NOTE — Telephone Encounter (Signed)
C/O burning, shooting pains in her breast one side more than the other.  She recently started HCTZ 12.5 mg for hypertension.  She wanted to know if Dr. Velvet Bathe thought that could be causing it.  I called her and recommended OV for breast exam and offered visit today.  She cannot come today but will call Monday after she gets to work and can check her schedule and see if it will work for her to come in soon.

## 2012-07-12 ENCOUNTER — Encounter: Payer: Self-pay | Admitting: Gynecology

## 2012-07-12 ENCOUNTER — Ambulatory Visit (INDEPENDENT_AMBULATORY_CARE_PROVIDER_SITE_OTHER): Payer: Managed Care, Other (non HMO) | Admitting: Gynecology

## 2012-07-12 DIAGNOSIS — N644 Mastodynia: Secondary | ICD-10-CM

## 2012-07-12 MED ORDER — IBUPROFEN 800 MG PO TABS: 800.0000 mg | ORAL_TABLET | Freq: Three times a day (TID) | ORAL | Status: DC | PRN

## 2012-07-12 NOTE — Patient Instructions (Signed)
Office will call you to arrange mammogram and ultrasound. Apply heat to the breast and take the Motrin 800 mg 3 times daily for discomfort.

## 2012-07-12 NOTE — Progress Notes (Signed)
Patient presents complaining of left breast burning sensation of the last week or 2. There is very intense radiating pain through her left tail of Spence region with a burning sensation radiating to her nipple. No discharge or significant swelling. Had been started on hydrochlorothiazide by her primary for generalized swelling which also sounds historically to be for borderline hypertension. No palpable masses to self-exam or other abnormalities.  Exam was Radiographer, therapeutic. Both breast examined lying and sitting without masses retractions discharge adenopathy.  Assessment and plan: Left tail of Spence burning pain. Present times several weeks. Doubt related to starting hydrochlorothiazide as not a commonly described side effect and does not affect both breasts. No evidence of mastitis or other inflammatory changes. Will check baseline prolactin and start with diagnostic mammogram ultrasound of the area. Recommended heat and ibuprofen 800 mg 3 times daily prescription written with one refill. Follow up for her mammogram ultrasound and we'll go from there.

## 2012-07-13 ENCOUNTER — Telehealth: Payer: Self-pay | Admitting: *Deleted

## 2012-07-13 NOTE — Telephone Encounter (Signed)
Pt informed appt on 07/18/12 2 9:00am at [facility]. Order faxed to [facility]

## 2012-07-13 NOTE — Telephone Encounter (Signed)
Message copied by Aura Camps on Wed Jul 13, 2012  8:50 AM ------      Message from: Dara Lords      Created: Tue Jul 12, 2012  5:04 PM       Schedule diagnostic mammogram and ultrasound reference new onset left tail of Spence pain. Normal exam. ------

## 2012-07-18 ENCOUNTER — Encounter: Payer: Self-pay | Admitting: Gynecology

## 2012-08-15 ENCOUNTER — Encounter: Payer: Self-pay | Admitting: Gynecology

## 2012-08-15 ENCOUNTER — Ambulatory Visit (INDEPENDENT_AMBULATORY_CARE_PROVIDER_SITE_OTHER): Payer: Managed Care, Other (non HMO) | Admitting: Gynecology

## 2012-08-15 VITALS — BP 120/76 | Ht 65.0 in | Wt 162.0 lb

## 2012-08-15 DIAGNOSIS — Z01419 Encounter for gynecological examination (general) (routine) without abnormal findings: Secondary | ICD-10-CM

## 2012-08-15 DIAGNOSIS — M810 Age-related osteoporosis without current pathological fracture: Secondary | ICD-10-CM

## 2012-08-15 DIAGNOSIS — Z13220 Encounter for screening for lipoid disorders: Secondary | ICD-10-CM

## 2012-08-15 DIAGNOSIS — M62838 Other muscle spasm: Secondary | ICD-10-CM | POA: Insufficient documentation

## 2012-08-15 MED ORDER — ALENDRONATE SODIUM 70 MG PO TABS: 70.0000 mg | ORAL_TABLET | ORAL | Status: DC

## 2012-08-15 NOTE — Progress Notes (Signed)
Sandra Morales 01-16-1956 [id number]        57 y.o.  [name] for annual exam.  Several issues noted below.  Past medical history,surgical history, medications, allergies, family history and social history were all reviewed and documented in the EPIC chart. ROS:  Was performed and pertinent positives and negatives are included in the history.  Exam: [name] Vitals:   08/15/12 1604  BP: 120/76  Height: 5\' 5"  (1.651 m)  Weight: 162 lb (73.483 kg)   General appearance  Normal Skin grossly normal Head/Neck normal with no cervical or supraclavicular adenopathy thyroid normal Lungs  clear Cardiac RR, without RMG Abdominal  soft, nontender, without masses, organomegaly or hernia Breasts  examined lying and sitting without masses, retractions, discharge or axillary adenopathy. Pelvic  Ext/BUS/vagina  normal with mild atrophic changes  Cervix  normal with mild atrophic changes  Uterus  anteverted, normal size, shape and contour, midline and mobile nontender   Adnexa  Without masses or tenderness    Anus and perineum  normal   Rectovaginal  normal sphincter tone without palpated masses or tenderness.   Assessment/Plan:  57 y.o. [name] female for annual exam.   1. Postmenopausal. Patient having some hot flashes and sweats. Also some discomfort with intercourse.  Had transiently tried HRT in the past but stopped it due to fear of side effects and risks. I rediscussed options to include OTC options. She is using [place] now. Moisturizers and lubricants also discussed. HRT reviewed both global versus vaginal. The risks to include the WHI study with stroke heart attack DVT breast cancer discussed. Options for Vagifem and Estrace cream and Osphena reviewed.  Patient declines at this time and prefers just to monitor her symptoms. 2. Weight gain/swelling. Does use diuretic intermittently by her primary physician. Does admit to not exercising at all. The need to incorporate exercise into  her daily regimen discussed encouraged. Continue to follow up with her primary in reference to her swelling. 3. Osteoporosis.  DEXA 10/2011 with T score -2.8. Had transiently been on alendronate 70 mg weekly but was discontinued in November due to complaints of swelling and bloating. These symptoms persisted despite Fosamax discontinuation and I think that this is not related to this. Options for treatment to include alendronate Actonel Boniva Prolia reviewed. We'll go ahead and restart alendronate 70 mg weekly now. We discussed the risks to include osteonecrosis of the jaw atypical fractures particularly with prolonged use GERD and esophageal cancer risk. 4. Mammography 06/2012. Continue with annual mammography. SBE monthly reviewed. 5. Pap smear 07/2010. No Pap smear done today. No history of significant abnormal Pap smears. Plan repeat next year at 3 year interval. 6. Colonoscopy 2011. Repeat at their recommended interval. 7. Health maintenance. Had CBC comprehensive metabolic panel TSH urinalysis done in November 2013. Lipid profile vitamin D ordered today. Followup in one year, sooner if she wants to rediscuss HRT has issues with alendronate.     Dara Lords MD, 4:50 PM 08/15/2012

## 2012-08-15 NOTE — Patient Instructions (Signed)
Office will contact you to schedule ultrasound. Otherwise followup in one year, sooner as needed.

## 2012-08-16 ENCOUNTER — Telehealth: Payer: Self-pay | Admitting: *Deleted

## 2012-08-16 ENCOUNTER — Encounter: Payer: Self-pay | Admitting: Gynecology

## 2012-08-16 LAB — VITAMIN D 25 HYDROXY (VIT D DEFICIENCY, FRACTURES): Vit D, 25-Hydroxy: 54 ng/mL (ref 30–89)

## 2012-08-16 LAB — LIPID PANEL
HDL: 52 mg/dL (ref >39)
LDL Cholesterol: 114 mg/dL — ABNORMAL HIGH (ref 0–99)
Total CHOL/HDL Ratio: 3.4 Ratio

## 2012-08-16 NOTE — Telephone Encounter (Signed)
TF staff message: Call patient. Tell her after she left I was thinking about her complaints and I want to go ahead and schedule an ultrasound given her abdominal bloating just to make sure everything looks good from the GYN standpoint. Pt has schedule on April 9th. She will continue taking fosamax as directed by TF OV note on 08/15/12.

## 2012-08-24 ENCOUNTER — Other Ambulatory Visit: Payer: Self-pay | Admitting: Gynecology

## 2012-08-24 DIAGNOSIS — R140 Abdominal distension (gaseous): Secondary | ICD-10-CM

## 2012-08-24 DIAGNOSIS — N83339 Acquired atrophy of ovary and fallopian tube, unspecified side: Secondary | ICD-10-CM

## 2012-08-31 ENCOUNTER — Ambulatory Visit (INDEPENDENT_AMBULATORY_CARE_PROVIDER_SITE_OTHER): Payer: Managed Care, Other (non HMO) | Admitting: Gynecology

## 2012-08-31 ENCOUNTER — Ambulatory Visit (INDEPENDENT_AMBULATORY_CARE_PROVIDER_SITE_OTHER): Payer: Managed Care, Other (non HMO)

## 2012-08-31 ENCOUNTER — Encounter: Payer: Self-pay | Admitting: Gynecology

## 2012-08-31 DIAGNOSIS — R143 Flatulence: Secondary | ICD-10-CM

## 2012-08-31 DIAGNOSIS — R140 Abdominal distension (gaseous): Secondary | ICD-10-CM

## 2012-08-31 DIAGNOSIS — R141 Gas pain: Secondary | ICD-10-CM

## 2012-08-31 DIAGNOSIS — N83339 Acquired atrophy of ovary and fallopian tube, unspecified side: Secondary | ICD-10-CM

## 2012-08-31 DIAGNOSIS — N951 Menopausal and female climacteric states: Secondary | ICD-10-CM

## 2012-08-31 NOTE — Progress Notes (Signed)
Patient follows up for ultrasound. I saw her for her annual exam as she was complaining of a lot of abdominal bloating and swelling. She had a normal exam. Had been evaluated by her primary and was started on intermittent diuretic.  After she left I thought about her situation and I called her to have her scheduled ultrasound just to make sure there is no pelvic pathology that we are missing such as ovarian.  Ultrasound shows uterus normal size and echotexture. Endometrial echo 3.9 mm. Right and left ovaries small and postmenopausal in size. Cul-de-sac negative. No evidence of pathology.  Assessment and plan: 1. Swelling and bloating. Ultrasound is normal. Recommend she follow up with her primary for further evaluation if it continues. 2. Menopausal symptoms.  She continues to have hot flashes and sweats. Some vaginal dryness. Had a very short trial of HRT but stopped it for fear of the side effects. I again discussed the options and risks to include the WHI study of stroke heart attack DVT and breast cancer. Would recommend starting on a patch such as Vivelle 0.05 and Prometrium 100 mg nightly. Patient still unsure we'll discuss at home and call me if she wants to pursue this. Otherwise she will follow up routinely.

## 2012-08-31 NOTE — Patient Instructions (Addendum)
Follow up with your primary care physician if your swelling continues. Call me if he was started on hormone replacement.

## 2012-09-23 ENCOUNTER — Telehealth: Payer: Self-pay | Admitting: *Deleted

## 2012-09-23 MED ORDER — ESTRADIOL 0.05 MG/24HR TD PTTW: 1.0000 | MEDICATED_PATCH | TRANSDERMAL | Status: DC

## 2012-09-23 MED ORDER — PROGESTERONE MICRONIZED 100 MG PO CAPS: ORAL_CAPSULE | ORAL | Status: DC

## 2012-09-23 NOTE — Telephone Encounter (Signed)
Pt informed with all the below 

## 2012-09-23 NOTE — Telephone Encounter (Signed)
I would recommend starting the Vivelle 0.05 patch and Prometrium 100 mg nightly. I don't think that they're necessarily is a time limit and this is an ongoing discussion annually as I see patients on HRT. I do not think Fosamax which make her hot flashes worse. I suspect it's just her.

## 2012-09-23 NOTE — Telephone Encounter (Signed)
Pt is calling to follow up with HRT, she states there were several options you gave her, I told the option in OV note Vivelle 0.05 and Prometrium 100 mg nightly and she is okay this if you believe this is best for her. She also mentioned to her PCP about starting HRT and per PCP she thought pt should only be on HRT for about 2 years and would like to know you thoughts about that? She also asked if you thought the fosamax could cause her to have worse hot flashes? Because she takes [place] too? Please advise

## 2012-11-07 ENCOUNTER — Telehealth: Payer: Self-pay | Admitting: *Deleted

## 2012-11-07 NOTE — Telephone Encounter (Signed)
Pt is currently taking Vivelle 0.05 patch and Prometrium 100 mg nightly, started on may 10 th, pt said she has noticed her breast very tender. Only noticed that since taking medication, pt is aware this is one of the side effects from taking medication. She asked how long should it take for this to resolve? Please advise

## 2012-11-07 NOTE — Telephone Encounter (Signed)
Usually 4-8 weeks. If they continue she could try stopping both for one week and then restarting both sometimes that will give the patient a break. She did do this every other month as needed.

## 2012-11-07 NOTE — Telephone Encounter (Signed)
Pt informed with the below note. 

## 2012-11-26 ENCOUNTER — Telehealth: Payer: Self-pay | Admitting: Gynecology

## 2012-11-26 NOTE — Telephone Encounter (Signed)
On Call Note:  On HRT vivelle/prometrium.  Started full period this AM.  No pain or other symptoms.  Recommend stop vivelle, continue prometrium, OV this week for further evaluation.

## 2012-11-28 ENCOUNTER — Encounter: Payer: Self-pay | Admitting: Gynecology

## 2012-11-28 ENCOUNTER — Ambulatory Visit (INDEPENDENT_AMBULATORY_CARE_PROVIDER_SITE_OTHER): Payer: Managed Care, Other (non HMO) | Admitting: Gynecology

## 2012-11-28 DIAGNOSIS — N950 Postmenopausal bleeding: Secondary | ICD-10-CM

## 2012-11-28 NOTE — Patient Instructions (Signed)
Follow up for ultrasound as scheduled 

## 2012-11-28 NOTE — Progress Notes (Signed)
Patient presents with episode of postmenopausal bleeding this past weekend. She reports a menses type flow that is now down to spotting. No significant pain or cramping. Had recently started on HRT to include Vivelle 0.05 mg patch and Prometrium 100 mg nightly. Recently had ultrasound in April for bloating that showed her endometrial echo 3.9 mm. Both right and left ovaries visualized and postmenopausal.  Exam with Sherrilyn Rist Asst. Abdomen soft nontender without masses guarding rebound organomegaly. Pelvic external BUS vagina with atrophic changes. Cervix normal without gross bleeding. Uterus normal size midline mobile nontender. Adnexa without masses or tenderness.  Assessment and plan: Postmenopausal bleeding. Recent initiation of HRT. Recent endometrial echo 3.9 mm. She does have an endometrial sample 2011 that showed area of questionable polyp with secretory endometrium. We'll start with sonohysterogram for endometrial assessment and then go from there. At this point she'll continue on her HRT.

## 2012-12-02 ENCOUNTER — Ambulatory Visit (INDEPENDENT_AMBULATORY_CARE_PROVIDER_SITE_OTHER): Payer: Managed Care, Other (non HMO) | Admitting: Gynecology

## 2012-12-02 ENCOUNTER — Ambulatory Visit (INDEPENDENT_AMBULATORY_CARE_PROVIDER_SITE_OTHER): Payer: Managed Care, Other (non HMO)

## 2012-12-02 ENCOUNTER — Other Ambulatory Visit: Payer: Self-pay | Admitting: Gynecology

## 2012-12-02 ENCOUNTER — Encounter: Payer: Self-pay | Admitting: Gynecology

## 2012-12-02 DIAGNOSIS — N950 Postmenopausal bleeding: Secondary | ICD-10-CM

## 2012-12-02 DIAGNOSIS — D251 Intramural leiomyoma of uterus: Secondary | ICD-10-CM

## 2012-12-02 DIAGNOSIS — Z79890 Hormone replacement therapy (postmenopausal): Secondary | ICD-10-CM

## 2012-12-02 NOTE — Progress Notes (Signed)
Patient presents for sonohysterogram with history of postmenopausal bleeding on HRT. She had a single episode of menstrual type flow this past week. No real pain or other symptoms.  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 Right ovary normal. Left ovary not visualized at adnexa negative. Cul-de-sac negative. Endometrial echo 5.3 mm.  Sonohysterogram performed, sterile technique, single-tooth tenaculum anterior lip stabilization required, easy catheter introduction, good distention with questionable polyp lower uterine segment measuring 6 x 8 x 3 mm. Endometrial sample taken, patient tolerated well.  Assessment and plan: Episode of postmenopausal bleeding on HRT. Question of lower uterine segment endometrial polyp. Difficult to clearly visualize do to the lower uterine segment location but appears to a polyp. Discussed with patient. We'll await biopsy results. Options to follow with repeat ultrasound in several months versus proceeding with hysteroscopy D&C now reviewed. She did have a biopsy 11/2009 that showed secret Tory endometrium with fragments suggestive of an endometrial polyp. We'll further discuss when biopsy results from today return.

## 2012-12-02 NOTE — Patient Instructions (Signed)
Office will call you with the biopsy results 

## 2012-12-05 ENCOUNTER — Telehealth: Payer: Self-pay

## 2012-12-05 NOTE — Telephone Encounter (Signed)
Patient was advised of below recommendation.  Patient said that she thought if biopsy was negative that you were only going to do D&C, Hyst if her bleeding continued.  She has not had anymore bleeding except what she had related to the biopsy.  She said to ask you because she does not want to have anything done she doesn't absolutely have to have done--especially "being put to sleep".

## 2012-12-05 NOTE — Telephone Encounter (Signed)
Message copied by Keenan Bachelor on Mon Dec 05, 2012  2:41 PM ------      Message from: Dara Lords      Created: Mon Dec 05, 2012  1:42 PM       Tell patient biopsy from ultrasound is benign. I reviewed her pathology from a biopsy in 2011 and it had some fragments to suggest endometrial polyp. I think given the question of a polyp now the most prudent thing would be to schedule a hysteroscopy D&C. It would be an outpatient procedure and if she is okay with proceeding with this then we'll go ahead and schedule it and she will come in preoperatively and I will go over everything with her. If she has any questions, let me know. ------

## 2012-12-07 ENCOUNTER — Telehealth: Payer: Self-pay | Admitting: Gynecology

## 2012-12-07 NOTE — Telephone Encounter (Signed)
I do see Dr. Kristie Cowman note where he spoke with patinet on 12/07/12.

## 2012-12-07 NOTE — Telephone Encounter (Signed)
Called patient in followup. Her ultrasound showed questionable polyp lower uterine segment. Her biopsy was benign. In review of her prior biopsies in 2011 she had a biopsy showing degenerating secretory endometrium with evidence of a polyp. We elected just to monitor at that time. With the most recent ultrasound suggesting polyp despite negative biopsy my recommendation would be to proceed with hysteroscopy D&C particularly since she is on HRT. Patient agrees with this. She is going through a lot at this point with her son having twins that are anticipated to be delivered and I told her that I do not think this is an emergency and we can do this 2 months from now as it is more just to make sure about a benign polyp. Patient will schedule any followup with me for a preoperative consult.

## 2012-12-07 NOTE — Telephone Encounter (Signed)
Dr. Velvet Bathe- I received surgery order from you.  I assume you addressed these concerns with her?

## 2012-12-08 ENCOUNTER — Telehealth: Payer: Self-pay

## 2012-12-08 NOTE — Telephone Encounter (Signed)
I called patient to discuss scheduling D&C, Hysteroscopy.  She was in her car and had an "emergency death in the family" and was headed there. She said it was not a good time and to let her call me next week. I will wait for her call.

## 2013-01-30 ENCOUNTER — Telehealth: Payer: Self-pay | Admitting: *Deleted

## 2013-01-30 NOTE — Telephone Encounter (Signed)
I would recommend patient stop the Fosamax for the next 2 weeks and to followup with Dr. Audie Box and to monitor her symptoms off the medication.

## 2013-01-30 NOTE — Telephone Encounter (Signed)
(  TF patient) pt had been taking fosamax 70 mg for months now, has been having some hip discomfort x 2 weeks now. But last night patient was in shower and had a sharp shooting right pain in right hip. She has not had any food, feeling shaky, no vomiting, no fever. Pt has been using some tramadol to help with the pain. She was having a hard time getting out of bed this am. Pt said once getting out of bed when she sits down the pain becomes worse.  Pt would like recommendations on what to do? Please advise

## 2013-01-30 NOTE — Telephone Encounter (Signed)
Pt informed with the below note. 

## 2013-02-02 ENCOUNTER — Telehealth: Payer: Self-pay

## 2013-02-02 NOTE — Telephone Encounter (Signed)
I called patient to follow up with her because we spoke back mid July about scheduling her D&C, Hysteroscopy. At that time she told me she had death in family and would call me the next week to schedule.  I called her today and she told me she is in terrible pain with her lower back. She saw orthopedist and neurologist this week and wants to wait a few weeks until this settles down.

## 2013-02-28 ENCOUNTER — Telehealth: Payer: Self-pay | Admitting: *Deleted

## 2013-02-28 NOTE — Telephone Encounter (Signed)
1. Pt called to follow from telephone encounter 01/30/13. Pt has been off fosamax 70 mg for 4 weeks now, no pain now, pt does not want to take fosamax anymore due to symptoms as noted on 01/30/13. She would like to know if bone medication is really needed? Recommendations?    2. Pt takes progesterone 100 mg and vivelle-dot 0.5 mg pt has noticed some hair loss. She is not having any hot flashes, would like to know if HRT could cause this? I told pt she may need to see dermatologist regarding to hair loss. Please advise

## 2013-02-28 NOTE — Telephone Encounter (Signed)
1. Options would be to stay off of any medication and repeat her bone density June 2015 which will be a 2 year interval and then make a decision at that time. Alternative would be to start a whole different class of medication such as Prolia. I think given her total picture I would recommend waiting until June 2015, repeat her bone density and then go from there. 2. Any hormonal manipulation can cause changes in hair growth cycling. We see this with pregnancy and after pregnancy as hormonal levels change. Normally we see more hair loss with menopause not taking HRT. I think she's getting more benefit from HRT and I would suggest continuing.

## 2013-02-28 NOTE — Telephone Encounter (Signed)
Pt informed with the below note. 

## 2013-03-29 ENCOUNTER — Telehealth: Payer: Self-pay

## 2013-03-29 ENCOUNTER — Other Ambulatory Visit: Payer: Self-pay | Admitting: Gynecology

## 2013-03-29 DIAGNOSIS — N840 Polyp of corpus uteri: Secondary | ICD-10-CM

## 2013-03-29 NOTE — Telephone Encounter (Signed)
Patient informed. She opts to repeat [facility] and understands that if polyp present Dr Velvet Bathe would recommend proceeding with D&C, Hyst. Of note, she has not had any vaginal bleeding since visit.

## 2013-03-29 NOTE — Telephone Encounter (Signed)
If she has done no further bleeding then the first option would be repeat the sonohysterogram. Her biopsy before was negative but there was a question of a polyp. If this is gone on the sonohysterogram that we will do nothing further. Second option would be to proceed with the hysteroscopy D&C recognizing that there may be nothing there and it would be an unnecessary procedure. I could go either way depending on how she feels. If she does have the ultrasound and it does look like there is a polyp that we will go ahead with the hysteroscopy D&C regardless.

## 2013-03-29 NOTE — Telephone Encounter (Signed)
Back on 12/07/12 you sent me surgery request for this patient for Hysteroscopy, D&C.  I called her to schedule but she could not at that point and said she would call me.  I did not hear from her so I called her again on 02/02/13 and she said she was dealing with some issues with back pain and needed to get through that and would call me when ready to schedule.  Today she called and is ready to schedule.  She said you told her (and you wrote on surgery slip) that she could have two months to get it scheduled.  Patient is questioning can she schedule it now or will she have to go through u/s or [facility] again?

## 2013-04-12 ENCOUNTER — Other Ambulatory Visit: Payer: Self-pay | Admitting: Gynecology

## 2013-04-19 ENCOUNTER — Other Ambulatory Visit: Payer: Self-pay | Admitting: Gynecology

## 2013-04-19 DIAGNOSIS — N840 Polyp of corpus uteri: Secondary | ICD-10-CM

## 2013-04-26 ENCOUNTER — Ambulatory Visit (INDEPENDENT_AMBULATORY_CARE_PROVIDER_SITE_OTHER): Payer: Managed Care, Other (non HMO) | Admitting: Gynecology

## 2013-04-26 ENCOUNTER — Ambulatory Visit (INDEPENDENT_AMBULATORY_CARE_PROVIDER_SITE_OTHER): Payer: Managed Care, Other (non HMO)

## 2013-04-26 ENCOUNTER — Other Ambulatory Visit: Payer: Self-pay | Admitting: Gynecology

## 2013-04-26 ENCOUNTER — Encounter: Payer: Self-pay | Admitting: Gynecology

## 2013-04-26 DIAGNOSIS — N882 Stricture and stenosis of cervix uteri: Secondary | ICD-10-CM

## 2013-04-26 DIAGNOSIS — D259 Leiomyoma of uterus, unspecified: Secondary | ICD-10-CM

## 2013-04-26 DIAGNOSIS — N83339 Acquired atrophy of ovary and fallopian tube, unspecified side: Secondary | ICD-10-CM

## 2013-04-26 DIAGNOSIS — N950 Postmenopausal bleeding: Secondary | ICD-10-CM

## 2013-04-26 DIAGNOSIS — N840 Polyp of corpus uteri: Secondary | ICD-10-CM

## 2013-04-26 DIAGNOSIS — R102 Pelvic and perineal pain: Secondary | ICD-10-CM

## 2013-04-26 DIAGNOSIS — Z79890 Hormone replacement therapy (postmenopausal): Secondary | ICD-10-CM

## 2013-04-26 DIAGNOSIS — N949 Unspecified condition associated with female genital organs and menstrual cycle: Secondary | ICD-10-CM

## 2013-04-26 MED ORDER — LIDOCAINE HCL 1 % IJ SOLN
5.0000 mL | Freq: Once | INTRAMUSCULAR | Status: AC
  Administered 2013-04-26: 5 mL

## 2013-04-26 NOTE — Progress Notes (Signed)
The patient presents for sonohysterogram. She has a history of postmenopausal bleeding in July. Sonohysterogram at that time showed a small defect 6 x 8 x 3 mm questionable small polyp or fragmented endometrium. Endometrial biopsy showed proliferative phase endometrium. Options for hysteroscopy or repeat set hysterogram discussed and we ultimately decided on hysteroscopy. She never followed up for this and ultimately we rediscussed the situation and she elected for repeat sonohysterogram now. She has done no further bleeding. She continues on HRT to include Vivelle 0.05 mg patches and Prometrium 100 mg nightly. She's doing well from that standpoint with good symptom relief.  Ultrasound shows uterus grossly normal with small myoma. Endometrial echo 6.0 mm Right ovary atrophic, left ovary not visualized. Cul-de-sac negative.  Sonohysterogram performed under sterile technique after 1% lidocaine anterior lip injection and single-tooth tenaculum stabilization, easy catheter introduction, good distention with persistent small 10 x 7 x 4 mm defect in this same area as prior sonohysterogram. Endometrial sample taken. Patient tolerated well.  Assessment and plan: Persistent small endometrial defect. Patient longer bleeding. Patient on HRT. Assuming biopsy negative discussed with patient whether we should proceed with hysteroscopy D&C or observation. No guarantees this small area is benign. Ultimately given the picture my recommendation would be to proceed with hysteroscopy D&C at her convenience to remove this defect as I think it will be an issue in the future to clear she continues on HRT. Patient agrees with the plan and she will followup for biopsy results and ultimately assuming no surprises findings on biopsy we'll move toward scheduling a hysteroscopy D&C at her convenience.

## 2013-04-26 NOTE — Patient Instructions (Signed)
Office will call you with biopsy results. As we discussed we will move towards scheduling a hysteroscopy D&C at your convenience.

## 2013-06-06 ENCOUNTER — Telehealth: Payer: Self-pay

## 2013-06-06 NOTE — Telephone Encounter (Signed)
I sent you surgical request slip

## 2013-06-06 NOTE — Telephone Encounter (Signed)
Patient ready to schedule surgery.  Your last note:  "Assessment and plan: Persistent small endometrial defect. Patient longer bleeding. Patient on HRT. Assuming biopsy negative discussed with patient whether we should proceed with hysteroscopy D&C or observation. No guarantees this small area is benign. Ultimately given the picture my recommendation would be to proceed with hysteroscopy D&C at her convenience to remove this defect as I think it will be an issue in the future to clear she continues on HRT. Patient agrees with the plan and she will followup for biopsy results and ultimately assuming no surprises findings on biopsy we'll move toward scheduling a hysteroscopy D&C at her convenience"  Will you want the [place]?

## 2013-06-07 ENCOUNTER — Other Ambulatory Visit: Payer: Self-pay | Admitting: Gynecology

## 2013-06-07 ENCOUNTER — Telehealth: Payer: Self-pay

## 2013-06-07 MED ORDER — MISOPROSTOL 200 MCG PO TABS: ORAL_TABLET | ORAL | Status: DC

## 2013-06-07 NOTE — Telephone Encounter (Signed)
Spoke with patient and scheduled her surgery for 06/30/13 at 1:00pm. Pre op cons set for 06/20/13 4:20pm.  Patient was instructed regarding need for Cytotec tab vaginally hs before surgery.  Rx was sent to pharmacy.  She will expect call from [facility] to arrange pre op there.

## 2013-06-07 NOTE — Telephone Encounter (Signed)
ERROR

## 2013-06-19 ENCOUNTER — Encounter (HOSPITAL_COMMUNITY): Payer: Self-pay | Admitting: Pharmacist

## 2013-06-19 ENCOUNTER — Telehealth: Payer: Self-pay

## 2013-06-19 NOTE — Telephone Encounter (Signed)
Patient called to ask about rescheduling her surgery to a day where she can have surgery at 7:30am.  She is currently scheduled at 1pm. She said she has dry mouth and cannot imagine going until 1pm without anything to drink.  We rescheduled her to 07/28/13.  She already had pre op consult set for tomorrow and I told her okay to keep that.

## 2013-06-20 ENCOUNTER — Encounter: Payer: Self-pay | Admitting: Gynecology

## 2013-06-20 ENCOUNTER — Ambulatory Visit (INDEPENDENT_AMBULATORY_CARE_PROVIDER_SITE_OTHER): Payer: Managed Care, Other (non HMO) | Admitting: Gynecology

## 2013-06-20 DIAGNOSIS — N950 Postmenopausal bleeding: Secondary | ICD-10-CM

## 2013-06-20 DIAGNOSIS — N644 Mastodynia: Secondary | ICD-10-CM

## 2013-06-20 NOTE — Patient Instructions (Signed)
Office will call you to arrange mammography. Followup at surgery as scheduled

## 2013-06-20 NOTE — H&P (Signed)
  Sandra Morales 1955/08/23 [id number]   History and Physical  Chief complaint: Postmenopausal bleeding on hormone replacement therapy  History of present illness: 58 y.o. [name] on HRT to include Vivelle 0.05 mg patches and Prometrium 100 mg nightly. Had episode of postmenopausal bleeding in July where sonohysterogram showed a small endometrial defect 6 x 8 x 3 mm. Endometrial biopsy showed proliferative endometrium. Options to include observation with followup ultrasound versus hysteroscopy D&C at that time reviewed and she preferred expectant management. Repeat sonohysterogram December 2014 showed persistence of the small endometrial defect now measuring 10 x 7 x 4 mm with endometrial biopsy showing inactive endometrium. Patient's admitted for hysteroscopy D&C to clear the endometrial defect as she continues on HRT.  Past medical history,surgical history, medications, allergies, family history and social history were all reviewed and documented in the EPIC chart. ROS:  Was performed and pertinent positives and negatives are included in the history of present illness.  Exam:  Kim assistant General: well developed, well nourished female, no acute distress HEENT: normal  Lungs: clear to auscultation without wheezing, rales or rhonchi  Cardiac: regular rate without rubs, murmurs or gallops  Abdomen: soft, nontender without masses, guarding, rebound, organomegaly  Breasts examined lying and sitting without masses retractions discharge adenopathy. Pelvic: external bus vagina: normal   Cervix: grossly normal  Uterus: normal size, midline and mobile, nontender  Adnexa: without masses or tenderness     Assessment/Plan:  59 y.o. [name] history of postmenopausal bleeding with history noted above. Admitted for hysteroscopy D&C removal of any endometrial pathology encountered. I reviewed with the patient the proposed surgery to include the expected intraoperative and postoperative courses as well as  the recovery period. The use of the hysteroscope, resectoscope, D&C portion of the procedure as well as the preoperative use of Cytotec was all reviewed. The risks to include bleeding, transfusion, infection, prolonged antibiotics, uterine perforation with damage to internal organs including bowel, bladder, ureters, vessels and nerves necessitating major exploratory reparative surgeries in the future reparative surgeries including bowel resection, ostomy formation, bladder repair, ureteral damage repair was all reviewed with her. Risks of distending media absorption leading to metabolic complications such as fluid overload, coma and seizures was also reviewed. No guarantees that she will no longer have bleeding or complications with her HRT was also reviewed. The patient's questions were answered to her satisfaction she is ready to proceed with surgery.   Note: This document was prepared with digital dictation and possible smart phrase technology. Any transcriptional errors that result from this process are unintentional.  [name], 5:03 PM 06/20/2013

## 2013-06-20 NOTE — Progress Notes (Addendum)
Sandra Morales Oct 18, 1955 [id number]   Preoperative consult  Chief complaint: Postmenopausal bleeding on hormone replacement therapy  History of present illness: 58 y.o. [name] on HRT to include Vivelle 0.05 mg patches and Prometrium 100 mg nightly. Had episode of postmenopausal bleeding in July where sonohysterogram showed a small endometrial defect 6 x 8 x 3 mm. Endometrial biopsy showed proliferative endometrium. Options to include observation with followup ultrasound versus hysteroscopy D&C at that time reviewed and she preferred expectant management. Repeat sonohysterogram December 2014 showed persistence of the small endometrial defect now measuring 10 x 7 x 4 mm with endometrial biopsy showing inactive endometrium. Patient's admitted for hysteroscopy D&C to clear the endometrial defect as she continues on HRT.  Patient also is complaining of 2 weeks of bilateral breast discomfort generalized. No masses discharge or other symptoms.   Past medical history,surgical history, medications, allergies, family history and social history were all reviewed and documented in the EPIC chart. ROS:  Was performed and pertinent positives and negatives are included in the history of present illness.  Exam:  Kim assistant General: well developed, well nourished female, no acute distress HEENT: normal  Lungs: clear to auscultation without wheezing, rales or rhonchi  Cardiac: regular rate without rubs, murmurs or gallops  Abdomen: soft, nontender without masses, guarding, rebound, organomegaly  Breasts examined lying and sitting without masses retractions discharge adenopathy. Pelvic: external bus vagina: normal   Cervix: grossly normal  Uterus: normal size, midline and mobile, nontender  Adnexa: without masses or tenderness     Assessment/Plan:  58 y.o. [name] history of postmenopausal bleeding with history noted above. Admitted for hysteroscopy D&C removal of any endometrial pathology encountered.  I reviewed with the patient the proposed surgery to include the expected intraoperative and postoperative courses as well as the recovery period. The use of the hysteroscope, resectoscope, D&C portion of the procedure as well as the preoperative use of Cytotec was all reviewed. The risks to include bleeding, transfusion, infection, prolonged antibiotics, uterine perforation with damage to internal organs including bowel, bladder, ureters, vessels and nerves necessitating major exploratory reparative surgeries in the future reparative surgeries including bowel resection, ostomy formation, bladder repair, ureteral damage repair was all reviewed with her. Risks of distending media absorption leading to metabolic complications such as fluid overload, coma and seizures was also reviewed. No guarantees that she will no longer have bleeding or complications with her HRT was also reviewed. The patient's questions were answered to her satisfaction she is ready to proceed with surgery.  Patient's having bilateral breast tenderness I think is related to her HRT. No specific localizing areas. She is due for her mammogram now we will order a diagnostic mammogram for completeness. Options to include continued HRT at present as this is a short lived complaint versus stopping her HRT for a week or so to see how she does. Alternatives to include lowering her estrogen dose or using intermittent progesterone such as every month or every other month withdrawal option but stopping the daily progesterone was also discussed. At this point the patient prefers to continue on her HRT followup for her mammogram continue with self breast exams and we'll go from there.   Note: This document was prepared with digital dictation and possible smart phrase technology. Any transcriptional errors that result from this process are unintentional.  [name], 4:55 PM 06/20/2013

## 2013-06-21 ENCOUNTER — Telehealth: Payer: Self-pay | Admitting: *Deleted

## 2013-06-21 NOTE — Telephone Encounter (Signed)
Message copied by Thamas Jaegers on Wed Jun 21, 2013  9:52 AM ------      Message from: [name]      Created: Tue Jun 20, 2013  4:55 PM       Scheduled diagnostic bilateral mammography reference bilateral breast pain, on HRT ------

## 2013-06-21 NOTE — Telephone Encounter (Signed)
appt on 06/22/13 @ 9:00 am pt informed with the below note, order faxed

## 2013-06-22 ENCOUNTER — Encounter: Payer: Self-pay | Admitting: Gynecology

## 2013-07-13 ENCOUNTER — Other Ambulatory Visit: Payer: Self-pay | Admitting: Gynecology

## 2013-07-13 MED ORDER — ESTRADIOL 0.05 MG/24HR TD PTTW: 1.0000 | MEDICATED_PATCH | TRANSDERMAL | Status: DC

## 2013-07-15 ENCOUNTER — Encounter (HOSPITAL_COMMUNITY): Payer: Self-pay | Admitting: Emergency Medicine

## 2013-07-15 ENCOUNTER — Emergency Department (HOSPITAL_COMMUNITY)
Admission: EM | Admit: 2013-07-15 | Discharge: 2013-07-15 | Disposition: A | Discharge status: Home / Self Care | Payer: Managed Care, Other (non HMO) | Attending: Emergency Medicine | Admitting: Emergency Medicine

## 2013-07-15 ENCOUNTER — Emergency Department (HOSPITAL_COMMUNITY): Payer: Managed Care, Other (non HMO)

## 2013-07-15 DIAGNOSIS — R42 Dizziness and giddiness: Secondary | ICD-10-CM | POA: Insufficient documentation

## 2013-07-15 DIAGNOSIS — R110 Nausea: Secondary | ICD-10-CM | POA: Insufficient documentation

## 2013-07-15 DIAGNOSIS — Z8679 Personal history of other diseases of the circulatory system: Secondary | ICD-10-CM | POA: Insufficient documentation

## 2013-07-15 DIAGNOSIS — F411 Generalized anxiety disorder: Secondary | ICD-10-CM | POA: Insufficient documentation

## 2013-07-15 DIAGNOSIS — Z79899 Other long term (current) drug therapy: Secondary | ICD-10-CM | POA: Insufficient documentation

## 2013-07-15 DIAGNOSIS — F419 Anxiety disorder, unspecified: Secondary | ICD-10-CM

## 2013-07-15 DIAGNOSIS — M810 Age-related osteoporosis without current pathological fracture: Secondary | ICD-10-CM | POA: Insufficient documentation

## 2013-07-15 DIAGNOSIS — J40 Bronchitis, not specified as acute or chronic: Secondary | ICD-10-CM

## 2013-07-15 DIAGNOSIS — Z87891 Personal history of nicotine dependence: Secondary | ICD-10-CM | POA: Insufficient documentation

## 2013-07-15 DIAGNOSIS — J209 Acute bronchitis, unspecified: Secondary | ICD-10-CM | POA: Insufficient documentation

## 2013-07-15 DIAGNOSIS — Z880 Allergy status to penicillin: Secondary | ICD-10-CM | POA: Insufficient documentation

## 2013-07-15 LAB — CBC WITH DIFFERENTIAL/PLATELET
BASOS PCT: 1 % (ref 0–1)
Basophils Absolute: 0 10*3/uL (ref 0.0–0.1)
EOS PCT: 1 % (ref 0–5)
Eosinophils Absolute: 0.1 10*3/uL (ref 0.0–0.7)
HEMATOCRIT: 37.8 % (ref 36.0–46.0)
HEMOGLOBIN: 12.8 g/dL (ref 12.0–15.0)
LYMPHS PCT: 21 % (ref 12–46)
Lymphs Abs: 1 10*3/uL (ref 0.7–4.0)
MCH: 29 pg (ref 26.0–34.0)
MCHC: 33.9 g/dL (ref 30.0–36.0)
MCV: 85.7 fL (ref 78.0–100.0)
Monocytes Absolute: 0.2 10*3/uL (ref 0.1–1.0)
Monocytes Relative: 5 % (ref 3–12)
Neutro Abs: 3.2 10*3/uL (ref 1.7–7.7)
Neutrophils Relative %: 73 % (ref 43–77)
Platelets: 213 10*3/uL (ref 150–400)
RBC: 4.41 MIL/uL (ref 3.87–5.11)
RDW: 11.9 % (ref 11.5–15.5)
WBC: 4.4 10*3/uL (ref 4.0–10.5)

## 2013-07-15 LAB — COMPREHENSIVE METABOLIC PANEL
ALT: 23 U/L (ref 0–35)
AST: 16 U/L (ref 0–37)
Albumin: 3.9 g/dL (ref 3.5–5.2)
Alkaline Phosphatase: 81 U/L (ref 39–117)
BUN: 10 mg/dL (ref 6–23)
CALCIUM: 9.2 mg/dL (ref 8.4–10.5)
CO2: 26 meq/L (ref 19–32)
CREATININE: 0.64 mg/dL (ref 0.50–1.10)
Chloride: 102 mEq/L (ref 96–112)
GFR calc Af Amer: >90 mL/min (ref >90)
Glucose, Bld: 119 mg/dL — ABNORMAL HIGH (ref 70–99)
Potassium: 3.9 mEq/L (ref 3.7–5.3)
Sodium: 141 mEq/L (ref 137–147)
Total Bilirubin: 0.4 mg/dL (ref 0.3–1.2)
Total Protein: 7 g/dL (ref 6.0–8.3)

## 2013-07-15 LAB — TROPONIN I

## 2013-07-15 LAB — D-DIMER, QUANTITATIVE (NOT AT ARMC)

## 2013-07-15 LAB — TSH: TSH: 1.262 u[IU]/mL (ref 0.350–4.500)

## 2013-07-15 MED ORDER — AZITHROMYCIN 250 MG PO TABS: ORAL_TABLET | ORAL | Status: DC

## 2013-07-15 MED ORDER — DM-GUAIFENESIN ER 30-600 MG PO TB12: 1.0000 | ORAL_TABLET | Freq: Two times a day (BID) | ORAL | Status: DC | PRN

## 2013-07-15 MED ORDER — LORAZEPAM 0.5 MG PO TABS
0.5000 mg | ORAL_TABLET | Freq: Once | ORAL | Status: AC
  Administered 2013-07-15: 0.5 mg via ORAL
  Filled 2013-07-15: qty 1

## 2013-07-15 MED ORDER — BENZONATATE 100 MG PO CAPS: 100.0000 mg | ORAL_CAPSULE | Freq: Three times a day (TID) | ORAL | Status: DC | PRN

## 2013-07-15 MED ORDER — MECLIZINE HCL 25 MG PO TABS: 25.0000 mg | ORAL_TABLET | Freq: Four times a day (QID) | ORAL | Status: DC | PRN

## 2013-07-15 NOTE — ED Notes (Signed)
Per pt, woke up with left neck, shoulder, arm discomfort-has had cough since January-states increased anxiety and feels like heart racing

## 2013-07-15 NOTE — Discharge Instructions (Signed)
Drink plenty of fluids. Take the antibiotic until gone. Use the cough medications as needed. Recheck if you get a fever, struggle to breathe, or you feel worse.    Bronchitis Bronchitis is inflammation of the airways that extend from the windpipe into the lungs (bronchi). The inflammation often causes mucus to develop, which leads to a cough. If the inflammation becomes severe, it may cause shortness of breath. CAUSES  Bronchitis may be caused by:   Viral infections.   Bacteria.   Cigarette smoke.   Allergens, pollutants, and other irritants.  SIGNS AND SYMPTOMS  The most common symptom of bronchitis is a frequent cough that produces mucus. Other symptoms include:  Fever.   Body aches.   Chest congestion.   Chills.   Shortness of breath.   Sore throat.  DIAGNOSIS  Bronchitis is usually diagnosed through a medical history and physical exam. Tests, such as chest X-rays, are sometimes done to rule out other conditions.  TREATMENT  You may need to avoid contact with whatever caused the problem (smoking, for example). Medicines are sometimes needed. These may include:  Antibiotics. These may be prescribed if the condition is caused by bacteria.  Cough suppressants. These may be prescribed for relief of cough symptoms.   Inhaled medicines. These may be prescribed to help open your airways and make it easier for you to breathe.   Steroid medicines. These may be prescribed for those with recurrent (chronic) bronchitis. HOME CARE INSTRUCTIONS  Get plenty of rest.   Drink enough fluids to keep your urine clear or pale yellow (unless you have a medical condition that requires fluid restriction). Increasing fluids may help thin your secretions and will prevent dehydration.   Only take over-the-counter or prescription medicines as directed by your health care provider.  Only take antibiotics as directed. Make sure you finish them even if you start to feel  better.  Avoid secondhand smoke, irritating chemicals, and strong fumes. These will make bronchitis worse. If you are a smoker, quit smoking. Consider using nicotine gum or skin patches to help control withdrawal symptoms. Quitting smoking will help your lungs heal faster.   Put a cool-mist humidifier in your bedroom at night to moisten the air. This may help loosen mucus. Change the water in the humidifier daily. You can also run the hot water in your shower and sit in the bathroom with the door closed for 5 10 minutes.   Follow up with your health care provider as directed.   Wash your hands frequently to avoid catching bronchitis again or spreading an infection to others.  SEEK MEDICAL CARE IF: Your symptoms do not improve after 1 week of treatment.  SEEK IMMEDIATE MEDICAL CARE IF:  Your fever increases.  You have chills.   You have chest pain.   You have worsening shortness of breath.   You have bloody sputum.  You faint.  You have lightheadedness.  You have a severe headache.   You vomit repeatedly. MAKE SURE YOU:   Understand these instructions.  Will watch your condition.  Will get help right away if you are not doing well or get worse. Document Released: 05/11/2005 Document Revised: 03/01/2013 Document Reviewed: 01/03/2013 [facility] Patient Information 2014 [place].  Dizziness Dizziness is a common problem. It is a feeling of unsteadiness or lightheadedness. You may feel like you are about to faint. Dizziness can lead to injury if you stumble or fall. A person of any age group can suffer from dizziness, but dizziness  is more common in older adults. CAUSES  Dizziness can be caused by many different things, including:  Middle ear problems.  Standing for too long.  Infections.  An allergic reaction.  Aging.  An emotional response to something, such as the sight of blood.  Side effects of medicines.  Fatigue.  Problems with circulation  or blood pressure.  Excess use of alcohol, medicines, or illegal drug use.  Breathing too fast (hyperventilation).  An arrhythmia or problems with your heart rhythm.  Low red blood cell count (anemia).  Pregnancy.  Vomiting, diarrhea, fever, or other illnesses that cause dehydration.  Diseases or conditions such as Parkinson's disease, high blood pressure (hypertension), diabetes, and thyroid problems.  Exposure to extreme heat. DIAGNOSIS  To find the cause of your dizziness, your caregiver may do a physical exam, lab tests, radiologic imaging scans, or an electrocardiography test (ECG).  TREATMENT  Treatment of dizziness depends on the cause of your symptoms and can vary greatly. HOME CARE INSTRUCTIONS   Drink enough fluids to keep your urine clear or pale yellow. This is especially important in very hot weather. In the elderly, it is also important in cold weather.  If your dizziness is caused by medicines, take them exactly as directed. When taking blood pressure medicines, it is especially important to get up slowly.  Rise slowly from chairs and steady yourself until you feel okay.  In the morning, first sit up on the side of the bed. When this seems okay, stand slowly while holding onto something until you know your balance is fine.  If you need to stand in one place for a long time, be sure to move your legs often. Tighten and relax the muscles in your legs while standing.  If dizziness continues to be a problem, have someone stay with you for a day or two. Do this until you feel you are well enough to stay alone. Have the person call your caregiver if he or she notices changes in you that are concerning.  Do not drive or use heavy machinery if you feel dizzy.  Do not drink alcohol. SEEK IMMEDIATE MEDICAL CARE IF:   Your dizziness or lightheadedness gets worse.  You feel nauseous or vomit.  You develop problems with talking, walking, weakness, or using your arms,  hands, or legs.  You are not thinking clearly or you have difficulty forming sentences. It may take a friend or family member to determine if your thinking is normal.  You develop chest pain, abdominal pain, shortness of breath, or sweating.  Your vision changes.  You notice any bleeding.  You have side effects from medicine that seems to be getting worse rather than better. MAKE SURE YOU:   Understand these instructions.  Will watch your condition.  Will get help right away if you are not doing well or get worse. Document Released: 11/04/2000 Document Revised: 08/03/2011 Document Reviewed: 11/28/2010 [facility] Patient Information 2014 [place], [place].

## 2013-07-15 NOTE — ED Provider Notes (Signed)
CSN: [id number]     Arrival date & time 07/15/13  [id number] History   First MD Initiated Contact with Patient 07/15/13 [phone]     Chief Complaint  Patient presents with  . Cough  . chest heaviness      (Consider location/radiation/quality/duration/timing/severity/associated sxs/prior Treatment) Patient is a 58 y.o. female presenting with cough.  Cough Patient reports she was awakened at 4 AM feeling anxious, which she reports is a precursor to when she has a rapid heartbeat. She states her hands and feet got clammy and then she had nausea. She states she had a normal bowel movement. She also reports she had some discomfort in her left neck radiating into her left shoulder. She denies any chest pain or back pain. She states that discomfort is now gone. She describes it as a nagging pain. She has had some pain in her neck and shoulder and left shoulder blade in the past that was sharp or shooting and this pain today was different. She reports she has had a rapid heartbeat in the past and was admitted over 5 years ago to the hospital. She was evaluated by a cardiologist and had a normal nuclear stress test. She does not have to see the cardiologist anymore. She states she feels that eating chocolate or caffeine makes her heart beat faster. She reports she's had a cough since January 21 that is dry. She states the cough is getting progressively worse. She denies any fever or sore throat. She states she did have rhinorrhea when it first started but that is improved. She reports she gets cold sores in her nose when she gets ill and she had them this time but they are now gone. She denies any pain in her legs and states she gets swelling in her legs periodically that she reports to eating saltine crackers. She denies any shortness of breath today. She states last night she took Delsym before she went to bed and has taken it about 3 times since she has had this coughing episode. She also reports that since she was  started on her hormone replacement she has dizziness which she states is a feeling that she is off balance or falling. She reports she is supposed to have a D&C in 2 weeks. Her husband reports she worries about a lot of things that are out of her control both at home and at work.She admits to some anxiety about her upcoming surgery.   Family history patient reports her paternal grandfather had a myocardial infarction otherwise family history is negative for heart disease.  Patient has been on hormone replacement since May.   PCP Dr Kathyrn Lass  Past Medical History  Diagnosis Date  . Lump in neck     left; MUSCLE TIGHTNESS  . MVP (mitral valve prolapse)   . Muscle spasm   . Osteoporosis 10/2011    T score -2.8   Past Surgical History  Procedure Laterality Date  . Ganglion cyst excision     Family History  Problem Relation Age of Onset  . Hyperlipidemia Mother   . Hypertension Father   . Hypertension Sister   . Breast cancer Maternal Grandmother 94  . Cancer Paternal Grandmother     Esophageal   History  Substance Use Topics  . Smoking status: Former Research scientist (life sciences)  . Smokeless tobacco: Never Used     Comment: IN HIGH SCHOOL AND EARLY COLLEGE  . Alcohol Use: Yes     Comment: Rare   Lives at  home Lives with spouse Employed    OB History   Grav Para Term Preterm Abortions TAB SAB Ect Mult Living   1 1 1       1      Review of Systems  Respiratory: Positive for cough.   All other systems reviewed and are negative.      Allergies  Codeine; Flagyl; Penicillins; Prozac; Sulphadimidine sodium; and Sulfa antibiotics  Home Medications   Current Outpatient Rx  Name  Route  Sig  Dispense  Refill  . Ca Phosphate-Cholecalciferol 250-400 MG-UNIT CHEW   Oral   Chew 2 tablets by mouth daily.         Marland Kitchen estradiol (VIVELLE-DOT) 0.05 MG/24HR patch   Transdermal   Place 1 patch (0.05 mg total) onto the skin 2 (two) times a week.   8 patch   1   . metaxalone (SKELAXIN) 800  MG tablet   Oral   Take 800 mg by mouth 2 (two) times daily as needed for muscle spasms (and muscle pain).          . misoprostol (CYTOTEC) 200 MCG tablet      Insert VAGINALLY hs before surgery.   1 tablet   0   . nabumetone (RELAFEN) 750 MG tablet   Oral   Take 750 mg by mouth 2 (two) times daily as needed for moderate pain.          . progesterone (PROMETRIUM) 100 MG capsule      Take 1 tablet by mouth daily a bedtime   30 capsule   11    BP 148/79  Pulse 95  Temp(Src) 97.8 F (36.6 C) (Oral)  Resp 17  SpO2 98%  Vital signs normal   Orthostatic VS are normal  Physical Exam  Nursing note and vitals reviewed. Constitutional: She is oriented to person, place, and time. She appears well-developed and well-nourished.  Non-toxic appearance. She does not appear ill. No distress.  HENT:  Head: Normocephalic and atraumatic.  Right Ear: External ear normal.  Left Ear: External ear normal.  Nose: Nose normal. No mucosal edema or rhinorrhea.  Mouth/Throat: Oropharynx is clear and moist and mucous membranes are normal. No dental abscesses or uvula swelling.  Eyes: Conjunctivae and EOM are normal. Pupils are equal, round, and reactive to light.  Neck: Normal range of motion and full passive range of motion without pain. Neck supple.  Cardiovascular: Normal rate, regular rhythm and normal heart sounds.  Exam reveals no gallop and no friction rub.   No murmur heard. Pulmonary/Chest: Effort normal and breath sounds normal. No respiratory distress. She has no wheezes. She has no rhonchi. She has no rales. She exhibits no tenderness and no crepitus.  Coughing during exam. I heard one faint wheeze during exam  Abdominal: Soft. Normal appearance and bowel sounds are normal. She exhibits no distension. There is no tenderness. There is no rebound and no guarding.  Musculoskeletal: Normal range of motion. She exhibits no edema and no tenderness.  Moves all extremities well.    Neurological: She is alert and oriented to person, place, and time. She has normal strength. No cranial nerve deficit.  Skin: Skin is warm, dry and intact. No rash noted. No erythema. No pallor.  Psychiatric: Her speech is normal and behavior is normal. Her mood appears anxious.    ED Course  Procedures (including critical care time)  Medications  LORazepam (ATIVAN) tablet 0.5 mg (0.5 mg Oral Given 07/15/13 1049)    Pt  given test results. Feeling alittle better, states she is less anxious. Is ready to be discharged.   Patient presents with cough for 2 weeks without evidence of pneumonia. She will be treated with antibiotics. Patient's nervousness may be related to her coughing for 2 weeks with poor sleep. She was given 2 different cough medications to take. Hopefully she will feel better when she is feeling better from her bronchitis and sleeping better.   Labs Review Results for orders placed during the hospital encounter of 07/15/13  CBC WITH DIFFERENTIAL      Result Value Ref Range   WBC 4.4  4.0 - 10.5 K/uL   RBC 4.41  3.87 - 5.11 MIL/uL   Hemoglobin 12.8  12.0 - 15.0 g/dL   HCT 37.8  36.0 - 46.0 %   MCV 85.7  78.0 - 100.0 fL   MCH 29.0  26.0 - 34.0 pg   MCHC 33.9  30.0 - 36.0 g/dL   RDW 11.9  11.5 - 15.5 %   Platelets 213  150 - 400 K/uL   Neutrophils Relative % 73  43 - 77 %   Neutro Abs 3.2  1.7 - 7.7 K/uL   Lymphocytes Relative 21  12 - 46 %   Lymphs Abs 1.0  0.7 - 4.0 K/uL   Monocytes Relative 5  3 - 12 %   Monocytes Absolute 0.2  0.1 - 1.0 K/uL   Eosinophils Relative 1  0 - 5 %   Eosinophils Absolute 0.1  0.0 - 0.7 K/uL   Basophils Relative 1  0 - 1 %   Basophils Absolute 0.0  0.0 - 0.1 K/uL  COMPREHENSIVE METABOLIC PANEL      Result Value Ref Range   Sodium 141  137 - 147 mEq/L   Potassium 3.9  3.7 - 5.3 mEq/L   Chloride 102  96 - 112 mEq/L   CO2 26  19 - 32 mEq/L   Glucose, Bld 119 (*) 70 - 99 mg/dL   BUN 10  6 - 23 mg/dL   Creatinine, Ser 0.64  0.50 -  1.10 mg/dL   Calcium 9.2  8.4 - 10.5 mg/dL   Total Protein 7.0  6.0 - 8.3 g/dL   Albumin 3.9  3.5 - 5.2 g/dL   AST 16  0 - 37 U/L   ALT 23  0 - 35 U/L   Alkaline Phosphatase 81  39 - 117 U/L   Total Bilirubin 0.4  0.3 - 1.2 mg/dL   GFR calc non Af Amer >90  >90 mL/min   GFR calc Af Amer >90  >90 mL/min  TROPONIN I      Result Value Ref Range   Troponin I <0.30  <0.30 ng/mL  D-DIMER, QUANTITATIVE      Result Value Ref Range   D-Dimer, Quant <0.27  0.00 - 0.48 ug/mL-FEU   Laboratory interpretation all normal    Imaging Review Dg Chest 2 View  07/15/2013   CLINICAL DATA:  Cough.  EXAM: CHEST  2 VIEW  COMPARISON:  04/15/2007  FINDINGS: The heart size and pulmonary vascularity are normal and the lungs are clear except for slight peribronchial thickening. No osseous abnormality.  IMPRESSION: Bronchitic changes which could be recurrent or chronic.   Electronically Signed   By: Rozetta Nunnery M.D.   On: 07/15/2013 09:16    EKG Interpretation    Date/Time:  Saturday July 15 2013 08:40:27 EST Ventricular Rate:  84 PR Interval:  167 QRS Duration: 97  QT Interval:  400 QTC Calculation: 473 R Axis:   2 Text Interpretation:  Sinus rhythm RSR' in V1 or V2, right VCD or RVH Otherwise within normal limits No significant change since last tracing 16 Apr 2007 Confirmed by [facility]  MD-I, Lesly Joslyn ([id number]) on 07/15/2013 9:07:38 AM            MDM   Final diagnoses:  Bronchitis  Anxiousness  Vertigo   New Prescriptions   AZITHROMYCIN (ZITHROMAX) 250 MG TABLET    Take 2 po the first day then once a day for the next 4 days.   BENZONATATE (TESSALON) 100 MG CAPSULE    Take 1 capsule (100 mg total) by mouth 3 (three) times daily as needed for cough.   DEXTROMETHORPHAN-GUAIFENESIN (MUCINEX DM) 30-600 MG PER 12 HR TABLET    Take 1 tablet by mouth 2 (two) times daily as needed for cough.   MECLIZINE (ANTIVERT) 25 MG TABLET    Take 1 tablet (25 mg total) by mouth 4 (four) times daily as needed for  dizziness.    Plan discharge   Rolland Porter, MD, Alanson Aly, MD 07/15/13 1116

## 2013-07-26 ENCOUNTER — Encounter (HOSPITAL_COMMUNITY): Payer: Self-pay

## 2013-07-26 ENCOUNTER — Encounter (HOSPITAL_COMMUNITY)
Admission: RE | Admit: 2013-07-26 | Discharge: 2013-07-26 | Disposition: A | Discharge status: Home / Self Care | Payer: Managed Care, Other (non HMO) | Source: Ambulatory Visit | Attending: Gynecology | Admitting: Gynecology

## 2013-07-26 NOTE — Patient Instructions (Signed)
Sandra Morales  07/26/2013   Your procedure is scheduled on:  07/28/13  Enter through the Main Entrance of [facility] up the phone at the desk and dial [phone].   Call this number if you have problems the morning of surgery: [phone]   Remember:   Do not eat food:After Midnight.  Do not drink clear liquids: After Midnight.  Take these medicines the morning of surgery with A SIP OF WATER: NA   Do not wear jewelry, make-up or nail polish.  Do not wear lotions, powders, or perfumes. You may wear deodorant.  Do not shave 24 hours prior to surgery.  Do not bring valuables to the hospital.  [facility] is not   responsible for any belongings or valuables brought to the hospital.  Contacts, dentures or bridgework may not be worn into surgery.  Leave suitcase in the car. After surgery it may be brought to your room.  For patients admitted to the hospital, checkout time is 11:00 AM the day of              discharge.   Patients discharged the day of surgery will not be allowed to drive             home.  Name and phone number of your driver: undecided  Special Instructions:      Please read over the following fact sheets that you were given:   Surgical Site Infection Prevention

## 2013-07-27 MED ORDER — CIPROFLOXACIN IN D5W 400 MG/200ML IV SOLN
400.0000 mg | INTRAVENOUS | Status: AC
  Administered 2013-07-28: 400 mg via INTRAVENOUS
  Filled 2013-07-27: qty 200

## 2013-07-27 MED ORDER — CLINDAMYCIN PHOSPHATE 900 MG/50ML IV SOLN
900.0000 mg | INTRAVENOUS | Status: AC
  Administered 2013-07-28: 900 mg via INTRAVENOUS
  Filled 2013-07-27: qty 50

## 2013-07-28 ENCOUNTER — Ambulatory Visit (HOSPITAL_COMMUNITY): Payer: Managed Care, Other (non HMO) | Admitting: Anesthesiology

## 2013-07-28 ENCOUNTER — Ambulatory Visit (HOSPITAL_COMMUNITY)
Admission: RE | Admit: 2013-07-28 | Discharge: 2013-07-28 | Disposition: A | Discharge status: Home / Self Care | Payer: Managed Care, Other (non HMO) | Source: Ambulatory Visit | Attending: Gynecology | Admitting: Gynecology

## 2013-07-28 ENCOUNTER — Encounter (HOSPITAL_COMMUNITY): Payer: Managed Care, Other (non HMO) | Admitting: Anesthesiology

## 2013-07-28 ENCOUNTER — Other Ambulatory Visit: Payer: Self-pay | Admitting: Gynecology

## 2013-07-28 ENCOUNTER — Encounter (HOSPITAL_COMMUNITY): Payer: Self-pay | Admitting: *Deleted

## 2013-07-28 ENCOUNTER — Encounter (HOSPITAL_COMMUNITY)
Admission: RE | Disposition: A | Discharge status: Home / Self Care | Payer: Self-pay | Source: Ambulatory Visit | Attending: Gynecology

## 2013-07-28 DIAGNOSIS — N840 Polyp of corpus uteri: Secondary | ICD-10-CM

## 2013-07-28 DIAGNOSIS — N950 Postmenopausal bleeding: Secondary | ICD-10-CM | POA: Insufficient documentation

## 2013-07-28 DIAGNOSIS — N841 Polyp of cervix uteri: Secondary | ICD-10-CM | POA: Insufficient documentation

## 2013-07-28 DIAGNOSIS — Z87891 Personal history of nicotine dependence: Secondary | ICD-10-CM | POA: Insufficient documentation

## 2013-07-28 DIAGNOSIS — N949 Unspecified condition associated with female genital organs and menstrual cycle: Secondary | ICD-10-CM

## 2013-07-28 DIAGNOSIS — N938 Other specified abnormal uterine and vaginal bleeding: Secondary | ICD-10-CM

## 2013-07-28 DIAGNOSIS — Z79890 Hormone replacement therapy (postmenopausal): Secondary | ICD-10-CM | POA: Insufficient documentation

## 2013-07-28 HISTORY — PX: DILATATION & CURETTAGE/HYSTEROSCOPY WITH TRUECLEAR: SHX6353

## 2013-07-28 SURGERY — DILATATION & CURETTAGE/HYSTEROSCOPY WITH TRUCLEAR
Anesthesia: General | Site: Vagina

## 2013-07-28 MED ORDER — PROMETHAZINE HCL 25 MG/ML IJ SOLN
INTRAMUSCULAR | Status: AC
  Administered 2013-07-28: 6.25 mg
  Filled 2013-07-28: qty 1

## 2013-07-28 MED ORDER — LIDOCAINE HCL 1 % IJ SOLN
INTRAMUSCULAR | Status: AC
  Filled 2013-07-28: qty 20

## 2013-07-28 MED ORDER — ONDANSETRON HCL 4 MG/2ML IJ SOLN
INTRAMUSCULAR | Status: AC
  Filled 2013-07-28: qty 2

## 2013-07-28 MED ORDER — OXYCODONE-ACETAMINOPHEN 5-325 MG PO TABS: 1.0000 | ORAL_TABLET | ORAL | Status: DC | PRN

## 2013-07-28 MED ORDER — ONDANSETRON HCL 8 MG PO TABS: 8.0000 mg | ORAL_TABLET | Freq: Three times a day (TID) | ORAL | Status: DC | PRN

## 2013-07-28 MED ORDER — ONDANSETRON HCL 4 MG/2ML IJ SOLN
INTRAMUSCULAR | Status: DC | PRN
  Administered 2013-07-28: 4 mg via INTRAVENOUS

## 2013-07-28 MED ORDER — SODIUM CHLORIDE 0.9 % IR SOLN
Status: DC | PRN
  Administered 2013-07-28: 3000 mL

## 2013-07-28 MED ORDER — MEPERIDINE HCL 25 MG/ML IJ SOLN: 6.2500 mg | INTRAMUSCULAR | Status: DC | PRN

## 2013-07-28 MED ORDER — PROPOFOL 10 MG/ML IV EMUL
INTRAVENOUS | Status: AC
  Filled 2013-07-28: qty 20

## 2013-07-28 MED ORDER — ONDANSETRON HCL 4 MG/2ML IJ SOLN
4.0000 mg | Freq: Once | INTRAMUSCULAR | Status: AC | PRN
  Administered 2013-07-28: 4 mg via INTRAVENOUS

## 2013-07-28 MED ORDER — LACTATED RINGERS IV SOLN
INTRAVENOUS | Status: DC
  Administered 2013-07-28 (×3): via INTRAVENOUS

## 2013-07-28 MED ORDER — LIDOCAINE HCL (CARDIAC) 20 MG/ML IV SOLN
INTRAVENOUS | Status: DC | PRN
  Administered 2013-07-28: 30 mg via INTRAVENOUS
  Administered 2013-07-28: 70 mg via INTRAVENOUS

## 2013-07-28 MED ORDER — LIDOCAINE HCL (CARDIAC) 20 MG/ML IV SOLN
INTRAVENOUS | Status: AC
  Filled 2013-07-28: qty 5

## 2013-07-28 MED ORDER — DEXAMETHASONE SODIUM PHOSPHATE 10 MG/ML IJ SOLN
INTRAMUSCULAR | Status: DC | PRN
  Administered 2013-07-28: 10 mg via INTRAVENOUS

## 2013-07-28 MED ORDER — FENTANYL CITRATE 0.05 MG/ML IJ SOLN: 25.0000 ug | INTRAMUSCULAR | Status: DC | PRN

## 2013-07-28 MED ORDER — DEXAMETHASONE SODIUM PHOSPHATE 10 MG/ML IJ SOLN
INTRAMUSCULAR | Status: AC
  Filled 2013-07-28: qty 1

## 2013-07-28 MED ORDER — FENTANYL CITRATE 0.05 MG/ML IJ SOLN
INTRAMUSCULAR | Status: AC
  Filled 2013-07-28: qty 2

## 2013-07-28 MED ORDER — KETOROLAC TROMETHAMINE 30 MG/ML IJ SOLN
INTRAMUSCULAR | Status: DC | PRN
  Administered 2013-07-28: 30 mg via INTRAVENOUS

## 2013-07-28 MED ORDER — KETOROLAC TROMETHAMINE 30 MG/ML IJ SOLN: 15.0000 mg | Freq: Once | INTRAMUSCULAR | Status: DC | PRN

## 2013-07-28 MED ORDER — MIDAZOLAM HCL 2 MG/2ML IJ SOLN
INTRAMUSCULAR | Status: AC
  Filled 2013-07-28: qty 2

## 2013-07-28 MED ORDER — LIDOCAINE HCL 1 % IJ SOLN
INTRAMUSCULAR | Status: DC | PRN
  Administered 2013-07-28: 10 mL

## 2013-07-28 MED ORDER — FENTANYL CITRATE 0.05 MG/ML IJ SOLN
INTRAMUSCULAR | Status: DC | PRN
  Administered 2013-07-28 (×2): 50 ug via INTRAVENOUS

## 2013-07-28 MED ORDER — PROPOFOL 10 MG/ML IV BOLUS
INTRAVENOUS | Status: DC | PRN
  Administered 2013-07-28: 170 mg via INTRAVENOUS

## 2013-07-28 MED ORDER — KETOROLAC TROMETHAMINE 30 MG/ML IJ SOLN
INTRAMUSCULAR | Status: AC
  Filled 2013-07-28: qty 1

## 2013-07-28 MED ORDER — PROMETHAZINE HCL 25 MG/ML IJ SOLN: 6.2500 mg | Freq: Once | INTRAMUSCULAR | Status: DC

## 2013-07-28 MED ORDER — MIDAZOLAM HCL 2 MG/2ML IJ SOLN
INTRAMUSCULAR | Status: DC | PRN
  Administered 2013-07-28: 2 mg via INTRAVENOUS

## 2013-07-28 SURGICAL SUPPLY — 27 items
BLADE INCISOR TRUC PLUS 2.9 (ABLATOR) IMPLANT
CANISTERS HI-FLOW 3000CC (CANNISTER) ×4 IMPLANT
CATH ROBINSON RED A/P 16FR (CATHETERS) ×3 IMPLANT
CLOTH BEACON ORANGE TIMEOUT ST (SAFETY) ×3 IMPLANT
CONTAINER PREFILL 10% NBF 60ML (FORM) ×4 IMPLANT
DRAPE HYSTEROSCOPY (DRAPE) ×3 IMPLANT
DRSG TELFA 3X8 NADH (GAUZE/BANDAGES/DRESSINGS) ×3 IMPLANT
GLOVE BIO SURGEON STRL SZ7.5 (GLOVE) ×3 IMPLANT
GLOVE BIO SURGEON STRL SZ8 (GLOVE) ×2 IMPLANT
GLOVE BIOGEL PI IND STRL 7.5 (GLOVE) IMPLANT
GLOVE BIOGEL PI IND STRL 8 (GLOVE) IMPLANT
GLOVE BIOGEL PI INDICATOR 7.5 (GLOVE) ×2
GLOVE BIOGEL PI INDICATOR 8 (GLOVE) ×2
GOWN STRL REUS W/ TWL XL LVL3 (GOWN DISPOSABLE) ×1 IMPLANT
GOWN STRL REUS W/TWL LRG LVL3 (GOWN DISPOSABLE) ×3 IMPLANT
GOWN STRL REUS W/TWL XL LVL3 (GOWN DISPOSABLE) ×3
INCISOR TRUC PLUS BLADE 2.9 (ABLATOR) ×3
KIT HYSTEROSCOPY TRUCLEAR (ABLATOR) ×2 IMPLANT
MORCELLATOR RECIP TRUCLEAR 4.0 (ABLATOR) IMPLANT
NDL SPNL 22GX3.5 QUINCKE BK (NEEDLE) IMPLANT
NEEDLE SPNL 22GX3.5 QUINCKE BK (NEEDLE) ×3 IMPLANT
PACK VAGINAL MINOR WOMEN LF (CUSTOM PROCEDURE TRAY) ×3 IMPLANT
PAD DRESSING TELFA 3X8 NADH (GAUZE/BANDAGES/DRESSINGS) ×1 IMPLANT
PAD OB MATERNITY 4.3X12.25 (PERSONAL CARE ITEMS) ×3 IMPLANT
SYR CONTROL 10ML LL (SYRINGE) ×3 IMPLANT
TOWEL OR 17X24 6PK STRL BLUE (TOWEL DISPOSABLE) ×6 IMPLANT
WATER STERILE IRR 1000ML POUR (IV SOLUTION) ×1 IMPLANT

## 2013-07-28 NOTE — H&P (Signed)
  The patient was examined.  I reviewed the proposed surgery and consent form with the patient.  The dictated history and physical is current and accurate and all questions were answered. The patient is ready to proceed with surgery and has a realistic understanding and expectation for the outcome.   [name], 7:00 AM 07/28/2013

## 2013-07-28 NOTE — Anesthesia Preprocedure Evaluation (Signed)
Anesthesia Evaluation    Reviewed: Allergy & Precautions, H&P , NPO status , Patient's Chart, lab work & pertinent test results  Airway Mallampati: I TM Distance: >3 FB Neck ROM: full    Dental no notable dental hx. (+) Teeth Intact   Pulmonary former smoker,    Pulmonary exam normal       Cardiovascular negative cardio ROS      Neuro/Psych negative neurological ROS  negative psych ROS   GI/Hepatic negative GI ROS, Neg liver ROS,   Endo/Other  negative endocrine ROS  Renal/GU negative Renal ROS     Musculoskeletal   Abdominal Normal abdominal exam  (+)   Peds  Hematology negative hematology ROS (+)   Anesthesia Other Findings   Reproductive/Obstetrics negative OB ROS                           Anesthesia Physical Anesthesia Plan  ASA: II  Anesthesia Plan: General   Post-op Pain Management:    Induction: Intravenous  Airway Management Planned: LMA  Additional Equipment:   Intra-op Plan:   Post-operative Plan:   Informed Consent: I have reviewed the patients History and Physical, chart, labs and discussed the procedure including the risks, benefits and alternatives for the proposed anesthesia with the patient or authorized representative who has indicated his/her understanding and acceptance.     Plan Discussed with: CRNA, Surgeon and Anesthesiologist  Anesthesia Plan Comments:         Anesthesia Quick Evaluation

## 2013-07-28 NOTE — Anesthesia Postprocedure Evaluation (Signed)
  Anesthesia Post-op Note  Patient: Sandra Morales  Procedure(s) Performed: Procedure(s): DILATATION & CURETTAGE/HYSTEROSCOPY WITH TRUCLEAR (N/A) Patient is awake and responsive. Pain and nausea are reasonably well controlled. Vital signs are stable and clinically acceptable. Oxygen saturation is clinically acceptable. There are no apparent anesthetic complications at this time. Patient is ready for discharge.

## 2013-07-28 NOTE — OR Nursing (Signed)
Pt discharged to home, skin warm dry pink resp even and unlabored . Nausea much better.

## 2013-07-28 NOTE — Transfer of Care (Signed)
Immediate Anesthesia Transfer of Care Note  Patient: Sandra Morales  Procedure(s) Performed: Procedure(s): DILATATION & CURETTAGE/HYSTEROSCOPY WITH TRUCLEAR (N/A)  Patient Location: PACU  Anesthesia Type:General  Level of Consciousness: awake, alert  and oriented  Airway & Oxygen Therapy: Patient Spontanous Breathing and Patient connected to nasal cannula oxygen  Post-op Assessment: Report given to PACU RN and Post -op Vital signs reviewed and stable  Post vital signs: Reviewed and stable  Complications: No apparent anesthesia complications

## 2013-07-28 NOTE — Discharge Instructions (Signed)
° °  Postoperative Instructions Hysteroscopy D & C ° °Dr. Fontaine and the nursing staff have discussed postoperative instructions with you.  If you have any questions please ask them before you leave the hospital, or call Dr Fontaine’s office at [phone].   ° °We would like to emphasize the following instructions: ° ° °? Call the office to make your follow-up appointment as recommended by Dr Fontaine (usually 1-2 weeks). ° °? You were given a prescription, or one was ordered for you at the pharmacy you designated.  Get that prescription filled and take the medication according to instructions. ° °? You may eat a regular diet, but slowly until you start having bowel movements. ° °? Drink plenty of water daily. ° °? Nothing in the vagina (intercourse, douching, objects of any kind) for two weeks.  When reinitiating intercourse, if it is uncomfortable, stop and make an appointment with Dr Fontaine to be evaluated. ° °? No driving for one to two days until the effects of anesthesia has worn off.  No traveling out of town for several days. ° °? You may shower, but no baths for one week.  Walking up and down stairs is ok.  No heavy lifting, prolonged standing, repeated bending or any “working out” until your post op check. ° °? Rest frequently, listen to your body and do not push yourself and overdo it. ° °? Call if: ° °o Your pain medication does not seem strong enough. °o Worsening pain or abdominal bloating °o Persistent nausea or vomiting °o Difficulty with urination or bowel movements. °o Temperature of 101 degrees or higher. °o Heavy vaginal bleeding.  If your period is due, you may use tampons. °o You have any questions or concerns ° ° ° °Post Anesthesia Home Care Instructions ° °Activity: °Get plenty of rest for the remainder of the day. A responsible adult should stay with you for 24 hours following the procedure.  °For the next 24 hours, DO NOT: °-Drive a car °-Operate machinery °-Drink alcoholic  beverages °-Take any medication unless instructed by your physician °-Make any legal decisions or sign important papers. ° °Meals: °Start with liquid foods such as gelatin or soup. Progress to regular foods as tolerated. Avoid greasy, spicy, heavy foods. If nausea and/or vomiting occur, drink only clear liquids until the nausea and/or vomiting subsides. Call your physician if vomiting continues. ° °Special Instructions/Symptoms: °Your throat may feel dry or sore from the anesthesia or the breathing tube placed in your throat during surgery. If this causes discomfort, gargle with warm salt water. The discomfort should disappear within 24 hours. ° °

## 2013-07-28 NOTE — Op Note (Signed)
Sandra Morales Dec 27, 1955 [id number]   Post Operative Note   Date of surgery:  07/28/2013  Pre Op Dx:  Postmenopausal bleeding, endometrial polyp  Post Op Dx:  Postmenopausal bleeding, endometrial polyp, endocervical polyp  Procedure:  Hysteroscopy, Truclear resection endometrial and endocervical polyps, D&C  Surgeon:  Donalynn Furlong P  Anesthesia:  General  EBL:  Minimal  Distended media discrepancy:  90 cc saline  Complications:  None  Specimen:  #1 endometrial/endocervical polyps #2 endometrial curetting to pathology  Findings: EUA:  External BUS vagina with atrophic changes. Cervix normal. Uterus axial normal size midline mobile. Adnexa without masses   Hysteroscopic:  Adequate noting fundus, anterior/posterior uterine surfaces, lower uterine segment, endocervical canal, right/left tubal ostia all visualized. Small endometrial polyp lateral left lower uterine segment. Several small endocervical polyps within the endocervical canal. Remainder of the endometrial cavity showed an atrophic pattern.  Procedure:  The patient was taken to the operating room, underwent general anesthesia, was placed in the low dorsal lithotomy position, received a perineal/vaginal preparation with Betadine solution per nursing personnel and the bladder was emptied with an in and out Foley catheterization. The timeout was performed by the surgical team. An EUA was performed. The patient was draped in the usual fashion. Cervix was visualized with a speculum, the anterior lip grasped with a single-tooth tenaculum and a paracervical block using 1% lidocaine was placed, a total of 10 cc. The cervix was then gently and gradually dilated to admit the small Truclear hysteroscope and hysteroscopy was performed with findings noted above. The small Truclear incisor was then used to resect both the endometrial and endocervical polyps. A sharp curettage was then performed with scant return. Both specimens were sent to  pathology. Repeat hysteroscopy showed an empty cavity and endocervical canal with good distention and no evidence of perforation. The tenaculum was removed with hemostasis visualized at the tenaculum site and external os. The patient received intraoperative Toradol, was awakened without difficulty and taken to recovery room in good condition having tolerated the procedure well.   Note: This document was prepared with digital dictation and possible smart phrase technology. Any transcriptional errors that result from this process are unintentional.  [name], 8:29 AM 07/28/2013

## 2013-07-31 ENCOUNTER — Encounter (HOSPITAL_COMMUNITY): Payer: Self-pay | Admitting: Gynecology

## 2013-08-01 ENCOUNTER — Telehealth: Payer: Self-pay | Admitting: *Deleted

## 2013-08-01 ENCOUNTER — Other Ambulatory Visit: Payer: Self-pay

## 2013-08-01 MED ORDER — ESTRADIOL 0.05 MG/24HR TD PTTW: 1.0000 | MEDICATED_PATCH | TRANSDERMAL | Status: DC

## 2013-08-01 NOTE — Telephone Encounter (Signed)
Pt called requesting refill on vivelle dot patch, annual schedule on 09/18/13. rx sent will 1 refill.

## 2013-08-14 ENCOUNTER — Encounter: Payer: Self-pay | Admitting: Gynecology

## 2013-08-14 ENCOUNTER — Ambulatory Visit (INDEPENDENT_AMBULATORY_CARE_PROVIDER_SITE_OTHER): Payer: Managed Care, Other (non HMO) | Admitting: Gynecology

## 2013-08-14 DIAGNOSIS — Z9889 Other specified postprocedural states: Secondary | ICD-10-CM

## 2013-08-14 MED ORDER — BENZONATATE 100 MG PO CAPS: 100.0000 mg | ORAL_CAPSULE | Freq: Three times a day (TID) | ORAL | Status: DC | PRN

## 2013-08-14 NOTE — Progress Notes (Signed)
Sandra Morales [birth year] [id number]        58 y.o.  [name] presents for her postoperative checkup status post hysteroscopy D&C removal of endometrial polyp. Has done well without complaints or bleeding.  Past medical history,surgical history, problem list, medications, allergies, family history and social history were all reviewed and documented in the EPIC chart.  Exam: Kim assistant General appearance  Normal External BUS vagina normal. Cervix normal. Uterus normal size midline mobile nontender. Adnexa without masses or tenderness.  Assessment/Plan:  58 y.o. [name] normal postoperative check status post hysteroscopy D&C. Reviewed pathology which showed benign endometrial polyps. Also reviewed pictures taken during the procedure. Patient has her annual exam scheduled in several weeks Will followup for this, sooner as needed. Patient asked if I would refill her Tessalon Perles prescribed elsewhere cough. I have refilled her #21 with no refills.   Note: This document was prepared with digital dictation and possible smart phrase technology. Any transcriptional errors that result from this process are unintentional.   [name], 4:25 PM 08/14/2013

## 2013-08-14 NOTE — Patient Instructions (Signed)
Followup for annual exam in several weeks.

## 2013-08-25 ENCOUNTER — Other Ambulatory Visit: Payer: Self-pay | Admitting: Gynecology

## 2013-09-18 ENCOUNTER — Telehealth: Payer: Self-pay | Admitting: *Deleted

## 2013-09-18 ENCOUNTER — Encounter: Payer: Managed Care, Other (non HMO) | Admitting: Gynecology

## 2013-09-18 MED ORDER — PROGESTERONE MICRONIZED 100 MG PO CAPS: ORAL_CAPSULE | ORAL | Status: DC

## 2013-09-18 MED ORDER — ESTRADIOL 0.05 MG/24HR TD PTTW: 1.0000 | MEDICATED_PATCH | TRANSDERMAL | Status: DC

## 2013-09-18 NOTE — Telephone Encounter (Signed)
Pt had to rescheule annual due to back pain, annual scheduled in June. Will need refills on HRT. rx will be sent

## 2013-10-16 ENCOUNTER — Other Ambulatory Visit: Payer: Self-pay | Admitting: Gynecology

## 2013-10-17 ENCOUNTER — Other Ambulatory Visit: Payer: Self-pay | Admitting: Gynecology

## 2013-10-23 DIAGNOSIS — IMO0002 Reserved for concepts with insufficient information to code with codable children: Secondary | ICD-10-CM

## 2013-10-23 HISTORY — DX: Reserved for concepts with insufficient information to code with codable children: IMO0002

## 2013-10-24 ENCOUNTER — Other Ambulatory Visit (HOSPITAL_COMMUNITY)
Admission: RE | Admit: 2013-10-24 | Discharge: 2013-10-24 | Disposition: A | Discharge status: Home / Self Care | Payer: Managed Care, Other (non HMO) | Source: Ambulatory Visit | Attending: Gynecology | Admitting: Gynecology

## 2013-10-24 ENCOUNTER — Ambulatory Visit (INDEPENDENT_AMBULATORY_CARE_PROVIDER_SITE_OTHER): Payer: Managed Care, Other (non HMO) | Admitting: Gynecology

## 2013-10-24 ENCOUNTER — Encounter: Payer: Self-pay | Admitting: Gynecology

## 2013-10-24 VITALS — BP 120/76 | Ht 65.0 in | Wt 158.0 lb

## 2013-10-24 DIAGNOSIS — Z01419 Encounter for gynecological examination (general) (routine) without abnormal findings: Secondary | ICD-10-CM

## 2013-10-24 DIAGNOSIS — M810 Age-related osteoporosis without current pathological fracture: Secondary | ICD-10-CM

## 2013-10-24 DIAGNOSIS — Z79890 Hormone replacement therapy (postmenopausal): Secondary | ICD-10-CM

## 2013-10-24 DIAGNOSIS — R87810 Cervical high risk human papillomavirus (HPV) DNA test positive: Secondary | ICD-10-CM | POA: Insufficient documentation

## 2013-10-24 DIAGNOSIS — Z1151 Encounter for screening for human papillomavirus (HPV): Secondary | ICD-10-CM | POA: Insufficient documentation

## 2013-10-24 DIAGNOSIS — Z124 Encounter for screening for malignant neoplasm of cervix: Secondary | ICD-10-CM | POA: Insufficient documentation

## 2013-10-24 MED ORDER — PROGESTERONE MICRONIZED 100 MG PO CAPS
ORAL_CAPSULE | ORAL | Status: DC
Start: 2013-10-24 — End: 2014-11-13

## 2013-10-24 MED ORDER — ESTRADIOL 0.05 MG/24HR TD PTTW: MEDICATED_PATCH | TRANSDERMAL | Status: DC

## 2013-10-24 NOTE — Progress Notes (Signed)
Sandra Morales Oct 17, 1955 [id number]        58 y.o.  [name] for annual exam.  Several issues noted below.  Past medical history,surgical history, problem list, medications, allergies, family history and social history were all reviewed and documented as reviewed in the EPIC chart.  ROS:  12 system ROS performed with pertinent positives and negatives included in the history, assessment and plan.  Included Systems: General, HEENT, Neck, Cardiovascular, Pulmonary, Gastrointestinal, Genitourinary, Musculoskeletal, Dermatologic, Endocrine, Hematological, Neurologic, Psychiatric Additional significant findings :  None   Exam: [name] Vitals:   10/24/13 1555  BP: 120/76  Height: 5\' 5"  (1.651 m)  Weight: 158 lb (71.668 kg)   General appearance:  Normal affect, orientation and appearance. Skin: Grossly normal HEENT: Without gross lesions.  No cervical or supraclavicular adenopathy. Thyroid normal.  Lungs:  Clear without wheezing, rales or rhonchi Cardiac: RR, without RMG Abdominal:  Soft, nontender, without masses, guarding, rebound, organomegaly or hernia Breasts:  Examined lying and sitting without masses, retractions, discharge or axillary adenopathy. Pelvic:  Ext/BUS/vagina with generalized atrophic changes  Cervix with atrophic changes. Pap/HPV  Uterus anteverted, normal size, shape and contour, midline and mobile nontender   Adnexa  Without masses or tenderness    Anus and perineum  Normal   Rectovaginal  Normal sphincter tone without palpated masses or tenderness.    Assessment/Plan:  58 y.o. G87P1001 female for annual exam.   1. Postmenopausal/HRT. Patient currently on Vivelle 0.05 and Prometrium 100 mg nightly. Doing well with good relief of her hot flushes and night sweats. No vaginal bleeding. Had removal of endometrial polyp earlier this year. Has done well since then. I again reviewed the risk of HRT to include the WHI study with increased risk of stroke or attack  DVT. The issues of lowest dose for shortest period of time. At this point since she is doing well we'll monitor continue this year and then rediscuss weaning next year and she is comfortable with this. I refilled both prescriptions x1 year. She has been worked up for episodic dizziness. She asked about the Prometrium contributing to this. She takes at night but is having the dizziness during the day. One option would be to discontinue it and start a different progesterone or hold on it altogether for a month or so and use estrogen only and see how she does. She is not want to do that at this point and wants to continue on the Prometrium but will call me if she wants to try this. Patient knows to call if any vaginal bleeding. 2. Osteoporosis. DEXA 10/2011 T score -2.8. Had transiently been on Fosamax for 5 or 6 months. Stopped it due to swelling and never restarted it as recommended previously. Will schedule DEXA now and then decide treatment plan afterwards the patient will follow up for this. Increase calcium vitamin D reviewed. Check vitamin D level today. 3. Mammography 05/2013. Continue with annual mammography. SBE monthly review. 4. Colonoscopy 2011. Repeat at their recommended interval. 5. Pap smear 2012. Pap/HPV today. No history of significant abnormal Pap smears. Plan repeat in 3-5 year interval. 6. Health maintenance. Baseline CBC comp he has a metabolic panel lipid profile urinalysis ordered. TSH earlier this year was normal. Followup for DEXA otherwise annually.   Note: This document was prepared with digital dictation and possible smart phrase technology. Any transcriptional errors that result from this process are unintentional.   [name], 5:16 PM 10/24/2013

## 2013-10-24 NOTE — Patient Instructions (Signed)
Followup for bone density as scheduled. Followup in one year for annual exam.  You may obtain a copy of any labs that were done today by logging onto MyChart as outlined in the instructions provided with your AVS (after visit summary). The office will not call with normal lab results but certainly if there are any significant abnormalities then we will contact you.   Health Maintenance, Female A healthy lifestyle and preventative care can promote health and wellness.  Maintain regular health, dental, and eye exams.  Eat a healthy diet. Foods like vegetables, fruits, whole grains, low-fat dairy products, and lean protein foods contain the nutrients you need without too many calories. Decrease your intake of foods high in solid fats, added sugars, and salt. Get information about a proper diet from your caregiver, if necessary.  Regular physical exercise is one of the most important things you can do for your health. Most adults should get at least 150 minutes of moderate-intensity exercise (any activity that increases your heart rate and causes you to sweat) each week. In addition, most adults need muscle-strengthening exercises on 2 or more days a week.   Maintain a healthy weight. The body mass index (BMI) is a screening tool to identify possible weight problems. It provides an estimate of body fat based on height and weight. Your caregiver can help determine your BMI, and can help you achieve or maintain a healthy weight. For adults 20 years and older:  A BMI below 18.5 is considered underweight.  A BMI of 18.5 to 24.9 is normal.  A BMI of 25 to 29.9 is considered overweight.  A BMI of 30 and above is considered obese.  Maintain normal blood lipids and cholesterol by exercising and minimizing your intake of saturated fat. Eat a balanced diet with plenty of fruits and vegetables. Blood tests for lipids and cholesterol should begin at age 44 and be repeated every 5 years. If your lipid or  cholesterol levels are high, you are over 50, or you are a high risk for heart disease, you may need your cholesterol levels checked more frequently.Ongoing high lipid and cholesterol levels should be treated with medicines if diet and exercise are not effective.  If you smoke, find out from your caregiver how to quit. If you do not use tobacco, do not start.  Lung cancer screening is recommended for adults aged 78 80 years who are at high risk for developing lung cancer because of a history of smoking. Yearly low-dose computed tomography (CT) is recommended for people who have at least a 30-pack-year history of smoking and are a current smoker or have quit within the past 15 years. A pack year of smoking is smoking an average of 1 pack of cigarettes a day for 1 year (for example: 1 pack a day for 30 years or 2 packs a day for 15 years). Yearly screening should continue until the smoker has stopped smoking for at least 15 years. Yearly screening should also be stopped for people who develop a health problem that would prevent them from having lung cancer treatment.  If you are pregnant, do not drink alcohol. If you are breastfeeding, be very cautious about drinking alcohol. If you are not pregnant and choose to drink alcohol, do not exceed 1 drink per day. One drink is considered to be 12 ounces (355 mL) of beer, 5 ounces (148 mL) of wine, or 1.5 ounces (44 mL) of liquor.  Avoid use of street drugs. Do not share  with anyone. Ask for help if you need support or instructions about stopping the use of drugs.  High blood pressure causes heart disease and increases the risk of stroke. Blood pressure should be checked at least every 1 to 2 years. Ongoing high blood pressure should be treated with medicines, if weight loss and exercise are not effective.  If you are 55 to 58 years old, ask your caregiver if you should take aspirin to prevent strokes.  Diabetes screening involves taking a blood sample  to check your fasting blood sugar level. This should be done once every 3 years, after age 45, if you are within normal weight and without risk factors for diabetes. Testing should be considered at a younger age or be carried out more frequently if you are overweight and have at least 1 risk factor for diabetes.  Breast cancer screening is essential preventative care for women. You should practice "breast self-awareness." This means understanding the normal appearance and feel of your breasts and may include breast self-examination. Any changes detected, no matter how small, should be reported to a caregiver. Women in their 20s and 30s should have a clinical breast exam (CBE) by a caregiver as part of a regular health exam every 1 to 3 years. After age 40, women should have a CBE every year. Starting at age 40, women should consider having a mammogram (breast X-ray) every year. Women who have a family history of breast cancer should talk to their caregiver about genetic screening. Women at a high risk of breast cancer should talk to their caregiver about having an MRI and a mammogram every year.  Breast cancer gene (BRCA)-related cancer risk assessment is recommended for women who have family members with BRCA-related cancers. BRCA-related cancers include breast, ovarian, tubal, and peritoneal cancers. Having family members with these cancers may be associated with an increased risk for harmful changes (mutations) in the breast cancer genes BRCA1 and BRCA2. Results of the assessment will determine the need for genetic counseling and BRCA1 and BRCA2 testing.  The Pap test is a screening test for cervical cancer. Women should have a Pap test starting at age 21. Between ages 21 and 29, Pap tests should be repeated every 2 years. Beginning at age 30, you should have a Pap test every 3 years as long as the past 3 Pap tests have been normal. If you had a hysterectomy for a problem that was not cancer or a condition  that could lead to cancer, then you no longer need Pap tests. If you are between ages 65 and 70, and you have had normal Pap tests going back 10 years, you no longer need Pap tests. If you have had past treatment for cervical cancer or a condition that could lead to cancer, you need Pap tests and screening for cancer for at least 20 years after your treatment. If Pap tests have been discontinued, risk factors (such as a new sexual partner) need to be reassessed to determine if screening should be resumed. Some women have medical problems that increase the chance of getting cervical cancer. In these cases, your caregiver may recommend more frequent screening and Pap tests.  The human papillomavirus (HPV) test is an additional test that may be used for cervical cancer screening. The HPV test looks for the virus that can cause the cell changes on the cervix. The cells collected during the Pap test can be tested for HPV. The HPV test could be used to screen women aged   aged 55 years and older, and should be used in women of any age who have unclear Pap test results. After the age of 76, women should have HPV testing at the same frequency as a Pap test.  Colorectal cancer can be detected and often prevented. Most routine colorectal cancer screening begins at the age of 64 and continues through age 83. However, your caregiver may recommend screening at an earlier age if you have risk factors for colon cancer. On a yearly basis, your caregiver may provide home test kits to check for hidden blood in the stool. Use of a small camera at the end of a tube, to directly examine the colon (sigmoidoscopy or colonoscopy), can detect the earliest forms of colorectal cancer. Talk to your caregiver about this at age 49, when routine screening begins. Direct examination of the colon should be repeated every 5 to 10 years through age 53, unless early forms of pre-cancerous polyps or small growths are found.  Hepatitis C blood testing is  recommended for all people born from [birth year] through 1965 and any individual with known risks for hepatitis C.  Practice safe sex. Use condoms and avoid high-risk sexual practices to reduce the spread of sexually transmitted infections (STIs). Sexually active women aged 64 and younger should be checked for Chlamydia, which is a common sexually transmitted infection. Older women with new or multiple partners should also be tested for Chlamydia. Testing for other STIs is recommended if you are sexually active and at increased risk.  Osteoporosis is a disease in which the bones lose minerals and strength with aging. This can result in serious bone fractures. The risk of osteoporosis can be identified using a bone density scan. Women ages 15 and over and women at risk for fractures or osteoporosis should discuss screening with their caregivers. Ask your caregiver whether you should be taking a calcium supplement or vitamin D to reduce the rate of osteoporosis.  Menopause can be associated with physical symptoms and risks. Hormone replacement therapy is available to decrease symptoms and risks. You should talk to your caregiver about whether hormone replacement therapy is right for you.  Use sunscreen. Apply sunscreen liberally and repeatedly throughout the day. You should seek shade when your shadow is shorter than you. Protect yourself by wearing long sleeves, pants, a wide-brimmed hat, and sunglasses year round, whenever you are outdoors.  Notify your caregiver of new moles or changes in moles, especially if there is a change in shape or color. Also notify your caregiver if a mole is larger than the size of a pencil eraser.  Stay current with your immunizations. Document Released: 11/24/2010 Document Revised: 09/05/2012 Document Reviewed: 11/24/2010 [facility] Patient Information 2014 [place].

## 2013-10-25 LAB — COMPREHENSIVE METABOLIC PANEL
ALT: 15 U/L (ref 0–35)
AST: 13 U/L (ref 0–37)
Albumin: 4.2 g/dL (ref 3.5–5.2)
Alkaline Phosphatase: 57 U/L (ref 39–117)
BILIRUBIN TOTAL: 0.4 mg/dL (ref 0.2–1.2)
BUN: 14 mg/dL (ref 6–23)
CHLORIDE: 100 meq/L (ref 96–112)
CO2: 27 mEq/L (ref 19–32)
Calcium: 9 mg/dL (ref 8.4–10.5)
Creat: 0.75 mg/dL (ref 0.50–1.10)
GLUCOSE: 104 mg/dL — AB (ref 70–99)
Potassium: 3.5 mEq/L (ref 3.5–5.3)
Sodium: 138 mEq/L (ref 135–145)
Total Protein: 6.1 g/dL (ref 6.0–8.3)

## 2013-10-25 LAB — LIPID PANEL
CHOL/HDL RATIO: 3.7 ratio
Cholesterol: 161 mg/dL (ref 0–200)
HDL: 43 mg/dL (ref >39)
LDL Cholesterol: 106 mg/dL — ABNORMAL HIGH (ref 0–99)
TRIGLYCERIDES: 58 mg/dL (ref <150)
VLDL: 12 mg/dL (ref 0–40)

## 2013-10-25 LAB — CBC WITH DIFFERENTIAL/PLATELET
Basophils Absolute: 0.1 10*3/uL (ref 0.0–0.1)
Basophils Relative: 1 % (ref 0–1)
EOS ABS: 0.1 10*3/uL (ref 0.0–0.7)
EOS PCT: 1 % (ref 0–5)
HEMATOCRIT: 35.8 % — AB (ref 36.0–46.0)
Hemoglobin: 12.4 g/dL (ref 12.0–15.0)
LYMPHS ABS: 2.1 10*3/uL (ref 0.7–4.0)
LYMPHS PCT: 32 % (ref 12–46)
MCH: 28.9 pg (ref 26.0–34.0)
MCHC: 34.6 g/dL (ref 30.0–36.0)
MCV: 83.4 fL (ref 78.0–100.0)
MONO ABS: 0.5 10*3/uL (ref 0.1–1.0)
MONOS PCT: 7 % (ref 3–12)
Neutro Abs: 3.8 10*3/uL (ref 1.7–7.7)
Neutrophils Relative %: 59 % (ref 43–77)
PLATELETS: 233 10*3/uL (ref 150–400)
RBC: 4.29 MIL/uL (ref 3.87–5.11)
RDW: 13 % (ref 11.5–15.5)
WBC: 6.5 10*3/uL (ref 4.0–10.5)

## 2013-10-25 LAB — URINALYSIS W MICROSCOPIC + REFLEX CULTURE
BILIRUBIN URINE: NEGATIVE
Bacteria, UA: NONE SEEN
Casts: NONE SEEN
GLUCOSE, UA: NEGATIVE mg/dL
Hgb urine dipstick: NEGATIVE
Ketones, ur: NEGATIVE mg/dL
LEUKOCYTES UA: NEGATIVE
Nitrite: NEGATIVE
PH: 5.5 (ref 5.0–8.0)
Protein, ur: NEGATIVE mg/dL
Urobilinogen, UA: 0.2 mg/dL (ref 0.0–1.0)

## 2013-10-25 LAB — VITAMIN D 25 HYDROXY (VIT D DEFICIENCY, FRACTURES): Vit D, 25-Hydroxy: 54 ng/mL (ref 30–89)

## 2013-10-26 LAB — CYTOLOGY - PAP

## 2013-10-26 LAB — URINE CULTURE
COLONY COUNT: NO GROWTH
Organism ID, Bacteria: NO GROWTH

## 2013-10-30 ENCOUNTER — Encounter: Payer: Self-pay | Admitting: Gynecology

## 2013-11-01 ENCOUNTER — Other Ambulatory Visit: Payer: Self-pay | Admitting: Otolaryngology

## 2013-11-01 ENCOUNTER — Encounter: Payer: Self-pay | Admitting: Gynecology

## 2013-11-01 DIAGNOSIS — R42 Dizziness and giddiness: Secondary | ICD-10-CM

## 2013-11-10 ENCOUNTER — Ambulatory Visit
Admission: RE | Admit: 2013-11-10 | Discharge: 2013-11-10 | Disposition: A | Discharge status: Home / Self Care | Payer: Managed Care, Other (non HMO) | Source: Ambulatory Visit | Attending: Otolaryngology | Admitting: Otolaryngology

## 2013-11-10 DIAGNOSIS — R42 Dizziness and giddiness: Secondary | ICD-10-CM

## 2013-11-10 MED ORDER — GADOBENATE DIMEGLUMINE 529 MG/ML IV SOLN
14.0000 mL | Freq: Once | INTRAVENOUS | Status: AC | PRN
  Administered 2013-11-10: 14 mL via INTRAVENOUS

## 2013-11-22 DIAGNOSIS — M810 Age-related osteoporosis without current pathological fracture: Secondary | ICD-10-CM

## 2013-11-22 HISTORY — DX: Age-related osteoporosis without current pathological fracture: M81.0

## 2013-11-28 ENCOUNTER — Encounter: Payer: Self-pay | Admitting: Gynecology

## 2013-11-28 ENCOUNTER — Ambulatory Visit (INDEPENDENT_AMBULATORY_CARE_PROVIDER_SITE_OTHER): Payer: Managed Care, Other (non HMO)

## 2013-11-28 DIAGNOSIS — M810 Age-related osteoporosis without current pathological fracture: Secondary | ICD-10-CM

## 2014-01-18 ENCOUNTER — Other Ambulatory Visit: Payer: Self-pay | Admitting: Family Medicine

## 2014-01-18 ENCOUNTER — Ambulatory Visit
Admission: RE | Admit: 2014-01-18 | Discharge: 2014-01-18 | Disposition: A | Discharge status: Home / Self Care | Payer: Managed Care, Other (non HMO) | Source: Ambulatory Visit | Attending: Family Medicine | Admitting: Family Medicine

## 2014-01-18 DIAGNOSIS — M2550 Pain in unspecified joint: Secondary | ICD-10-CM

## 2014-03-09 ENCOUNTER — Other Ambulatory Visit: Payer: Self-pay

## 2014-03-26 ENCOUNTER — Encounter: Payer: Self-pay | Admitting: Gynecology

## 2014-05-08 ENCOUNTER — Telehealth: Payer: Self-pay | Admitting: *Deleted

## 2014-05-08 NOTE — Telephone Encounter (Signed)
Pt called c/o feeling sick went to pick up her vivelle dot patch and realized it was switched to generic. Pt said she had loose BM, I explained to pt there was no for sure way I could tell her this was due to the patch. Pt said she has a lot of personal stuff going on as well. Pt said she will watch for now, feels better today then last night. Will follow up if needed.

## 2014-05-21 ENCOUNTER — Telehealth: Payer: Self-pay | Admitting: *Deleted

## 2014-05-21 MED ORDER — VIVELLE-DOT 0.05 MG/24HR TD PTTW: 1.0000 | MEDICATED_PATCH | TRANSDERMAL | Status: DC

## 2014-05-21 NOTE — Telephone Encounter (Signed)
Pt called c/o that the generic patch estradiol 0.05 mg was making her sick with stomach pain, and diarrhea. Pharmacist gave patch a brand vivelle dot patch on christmas eve and she done well with it. Pt asked if Rx could be brand only, rx sent.

## 2014-07-11 ENCOUNTER — Ambulatory Visit (INDEPENDENT_AMBULATORY_CARE_PROVIDER_SITE_OTHER): Payer: Managed Care, Other (non HMO) | Admitting: Gynecology

## 2014-07-11 ENCOUNTER — Encounter: Payer: Self-pay | Admitting: Gynecology

## 2014-07-11 VITALS — BP 140/78

## 2014-07-11 DIAGNOSIS — Z79890 Hormone replacement therapy (postmenopausal): Secondary | ICD-10-CM

## 2014-07-11 NOTE — Patient Instructions (Signed)
Wean off of the hormone replacement as we discussed. Call me if you have any issues.

## 2014-07-11 NOTE — Progress Notes (Signed)
Sandra Morales 05-24-1956 [id number]        59 y.o.  [name] presents to discuss her HRT.  Currently on Vivelle 0.05 mg patches and Prometrium 100 mg nightly. Still is having issues of swimmy headedness time to when she puts on a new patch for the first day or 2. Does not seem to be linked to the Prometrium although we discussed whether it's contributing to this. We have discussed this in the past but she is now more concerned about wanting to try something different like stopping the HRT altogether.  Past medical history,surgical history, problem list, medications, allergies, family history and social history were all reviewed and documented in the EPIC chart.  Directed ROS with pertinent positives and negatives documented in the history of present illness/assessment and plan.   Assessment/Plan:  59 y.o. [name] we discussed in detail the whole issue of HRT again as we have in the past. I discussed the risks to include stroke heart attack DVT breast cancer. She's done no bleeding on the regimen following her hysteroscopy D&C last year. Options to include switching to a different regiment such as an oral regimen or decreasing her dose to a lower dose patch was discussed. Possibly switching off of the Prometrium onto Provera reviewed. At this point the patient really would like to stop HRT if she could. Recommended that she cut her patch in half and use half a patch for the next several weeks. Continue on the Prometrium 100 mg at bedtime. Stop the patches after several weeks and will see how she does. She'll call if she is having any issues and we need to consider modifying this approach. Dr. Melton Alar who is following her for muscle spasm/headaches have discussed whether starting on an anxiolytic might benefit her as he feels stress is a component from his standpoint. She has had reactions to antidepressants to include an emergency room visit after starting Prozac. At this point I reviewed with her do not feel  comfortable starting anything new but we should do one thing at a time and we'll go ahead and try to wean her off of the HRT and see how she does. Patient will call me she has any issues. 20 minutes face-to-face discussion time.     Sandra Auerbach MD, 4:19 PM 07/11/2014

## 2014-07-13 ENCOUNTER — Ambulatory Visit: Payer: Managed Care, Other (non HMO) | Admitting: Gynecology

## 2014-07-18 ENCOUNTER — Telehealth: Payer: Self-pay | Admitting: *Deleted

## 2014-07-18 NOTE — Telephone Encounter (Signed)
At this point since she is weaning is not unexpected that she may have a little bit of bleeding. Once she has discontinued the hormones if she has any continued bleeding then she needs to follow up for evaluation/sonohysterogram rule out recurrent polyps or other abnormalities. At this point though I would just watch until she is off of hormones.

## 2014-07-18 NOTE — Telephone Encounter (Signed)
Pt informed with the below note, pt will follow up.

## 2014-07-18 NOTE — Telephone Encounter (Signed)
Pt called to follow up from Sandra Morales on 07/11/14 has started weaning off HRT did half at patch saturday and Tuesday. Taking Prometrium at bedtime as directed. Pt said noticed spotting yesterday only when wiping, and today noticed a small clot. No previous spotting/bleeding when taking HRT. Pt asked me to relay information to you. Please advise

## 2014-08-02 ENCOUNTER — Telehealth: Payer: Self-pay

## 2014-08-02 NOTE — Telephone Encounter (Signed)
correct 

## 2014-08-02 NOTE — Telephone Encounter (Signed)
Patient informed with last patch to take last progesterone.

## 2014-08-02 NOTE — Telephone Encounter (Signed)
Had planned with you awhile back to wean off her HRT.  She is questioning when to stop her Progesterone. She has two patches left and will put one on Sat and the last on on Tuesday. She asked should she stop Progesterone on Tues with last patch?

## 2014-08-23 ENCOUNTER — Encounter: Payer: Self-pay | Admitting: Gynecology

## 2014-11-13 ENCOUNTER — Other Ambulatory Visit (HOSPITAL_COMMUNITY)
Admission: RE | Admit: 2014-11-13 | Discharge: 2014-11-13 | Disposition: A | Discharge status: Home / Self Care | Payer: Managed Care, Other (non HMO) | Source: Ambulatory Visit | Attending: Gynecology | Admitting: Gynecology

## 2014-11-13 ENCOUNTER — Ambulatory Visit (INDEPENDENT_AMBULATORY_CARE_PROVIDER_SITE_OTHER): Payer: Managed Care, Other (non HMO) | Admitting: Gynecology

## 2014-11-13 ENCOUNTER — Encounter: Payer: Self-pay | Admitting: Gynecology

## 2014-11-13 VITALS — BP 122/76 | Ht 65.0 in | Wt 152.0 lb

## 2014-11-13 DIAGNOSIS — M8580 Other specified disorders of bone density and structure, unspecified site: Secondary | ICD-10-CM | POA: Diagnosis not present

## 2014-11-13 DIAGNOSIS — Z01419 Encounter for gynecological examination (general) (routine) without abnormal findings: Secondary | ICD-10-CM | POA: Diagnosis not present

## 2014-11-13 DIAGNOSIS — R87810 Cervical high risk human papillomavirus (HPV) DNA test positive: Secondary | ICD-10-CM | POA: Diagnosis not present

## 2014-11-13 DIAGNOSIS — Z1151 Encounter for screening for human papillomavirus (HPV): Secondary | ICD-10-CM | POA: Diagnosis present

## 2014-11-13 DIAGNOSIS — N952 Postmenopausal atrophic vaginitis: Secondary | ICD-10-CM

## 2014-11-13 LAB — CBC WITH DIFFERENTIAL/PLATELET
BASOS PCT: 0 % (ref 0–1)
Basophils Absolute: 0 10*3/uL (ref 0.0–0.1)
EOS ABS: 0.2 10*3/uL (ref 0.0–0.7)
Eosinophils Relative: 2 % (ref 0–5)
HCT: 38.3 % (ref 36.0–46.0)
Hemoglobin: 13 g/dL (ref 12.0–15.0)
LYMPHS ABS: 2.4 10*3/uL (ref 0.7–4.0)
Lymphocytes Relative: 31 % (ref 12–46)
MCH: 28.4 pg (ref 26.0–34.0)
MCHC: 33.9 g/dL (ref 30.0–36.0)
MCV: 83.6 fL (ref 78.0–100.0)
MPV: 10 fL (ref 8.6–12.4)
Monocytes Absolute: 0.5 10*3/uL (ref 0.1–1.0)
Monocytes Relative: 6 % (ref 3–12)
NEUTROS PCT: 61 % (ref 43–77)
Neutro Abs: 4.6 10*3/uL (ref 1.7–7.7)
PLATELETS: 243 10*3/uL (ref 150–400)
RBC: 4.58 MIL/uL (ref 3.87–5.11)
RDW: 12.8 % (ref 11.5–15.5)
WBC: 7.6 10*3/uL (ref 4.0–10.5)

## 2014-11-13 LAB — LIPID PANEL
CHOLESTEROL: 162 mg/dL (ref 0–200)
HDL: 48 mg/dL (ref >=46)
LDL Cholesterol: 104 mg/dL — ABNORMAL HIGH (ref 0–99)
Total CHOL/HDL Ratio: 3.4 Ratio
Triglycerides: 51 mg/dL (ref <150)
VLDL: 10 mg/dL (ref 0–40)

## 2014-11-13 LAB — COMPREHENSIVE METABOLIC PANEL
ALT: 33 U/L (ref 0–35)
AST: 19 U/L (ref 0–37)
Albumin: 4.3 g/dL (ref 3.5–5.2)
Alkaline Phosphatase: 76 U/L (ref 39–117)
BUN: 17 mg/dL (ref 6–23)
CALCIUM: 9.2 mg/dL (ref 8.4–10.5)
CHLORIDE: 104 meq/L (ref 96–112)
CO2: 27 mEq/L (ref 19–32)
Creat: 0.62 mg/dL (ref 0.50–1.10)
Glucose, Bld: 100 mg/dL — ABNORMAL HIGH (ref 70–99)
Potassium: 4.1 mEq/L (ref 3.5–5.3)
SODIUM: 141 meq/L (ref 135–145)
Total Bilirubin: 0.4 mg/dL (ref 0.2–1.2)
Total Protein: 6.6 g/dL (ref 6.0–8.3)

## 2014-11-13 LAB — TSH: TSH: 1.73 u[IU]/mL (ref 0.350–4.500)

## 2014-11-13 NOTE — Patient Instructions (Signed)
You may obtain a copy of any labs that were done today by logging onto MyChart as outlined in the instructions provided with your AVS (after visit summary). The office will not call with normal lab results but certainly if there are any significant abnormalities then we will contact you.   Health Maintenance, Female A healthy lifestyle and preventative care can promote health and wellness.  Maintain regular health, dental, and eye exams.  Eat a healthy diet. Foods like vegetables, fruits, whole grains, low-fat dairy products, and lean protein foods contain the nutrients you need without too many calories. Decrease your intake of foods high in solid fats, added sugars, and salt. Get information about a proper diet from your caregiver, if necessary.  Regular physical exercise is one of the most important things you can do for your health. Most adults should get at least 150 minutes of moderate-intensity exercise (any activity that increases your heart rate and causes you to sweat) each week. In addition, most adults need muscle-strengthening exercises on 2 or more days a week.   Maintain a healthy weight. The body mass index (BMI) is a screening tool to identify possible weight problems. It provides an estimate of body fat based on height and weight. Your caregiver can help determine your BMI, and can help you achieve or maintain a healthy weight. For adults 20 years and older:  A BMI below 18.5 is considered underweight.  A BMI of 18.5 to 24.9 is normal.  A BMI of 25 to 29.9 is considered overweight.  A BMI of 30 and above is considered obese.  Maintain normal blood lipids and cholesterol by exercising and minimizing your intake of saturated fat. Eat a balanced diet with plenty of fruits and vegetables. Blood tests for lipids and cholesterol should begin at age 34 and be repeated every 5 years. If your lipid or cholesterol levels are high, you are over 50, or you are a high risk for heart  disease, you may need your cholesterol levels checked more frequently.Ongoing high lipid and cholesterol levels should be treated with medicines if diet and exercise are not effective.  If you smoke, find out from your caregiver how to quit. If you do not use tobacco, do not start.  Lung cancer screening is recommended for adults aged 5 80 years who are at high risk for developing lung cancer because of a history of smoking. Yearly low-dose computed tomography (CT) is recommended for people who have at least a 30-pack-year history of smoking and are a current smoker or have quit within the past 15 years. A pack year of smoking is smoking an average of 1 pack of cigarettes a day for 1 year (for example: 1 pack a day for 30 years or 2 packs a day for 15 years). Yearly screening should continue until the smoker has stopped smoking for at least 15 years. Yearly screening should also be stopped for people who develop a health problem that would prevent them from having lung cancer treatment.  If you are pregnant, do not drink alcohol. If you are breastfeeding, be very cautious about drinking alcohol. If you are not pregnant and choose to drink alcohol, do not exceed 1 drink per day. One drink is considered to be 12 ounces (355 mL) of beer, 5 ounces (148 mL) of wine, or 1.5 ounces (44 mL) of liquor.  Avoid use of street drugs. Do not share needles with anyone. Ask for help if you need support or instructions about stopping  the use of drugs.  High blood pressure causes heart disease and increases the risk of stroke. Blood pressure should be checked at least every 1 to 2 years. Ongoing high blood pressure should be treated with medicines, if weight loss and exercise are not effective.  If you are 59 to 59 years old, ask your caregiver if you should take aspirin to prevent strokes.  Diabetes screening involves taking a blood sample to check your fasting blood sugar level. This should be done once every 3  years, after age 91, if you are within normal weight and without risk factors for diabetes. Testing should be considered at a younger age or be carried out more frequently if you are overweight and have at least 1 risk factor for diabetes.  Breast cancer screening is essential preventative care for women. You should practice "breast self-awareness." This means understanding the normal appearance and feel of your breasts and may include breast self-examination. Any changes detected, no matter how small, should be reported to a caregiver. Women in their 66s and 30s should have a clinical breast exam (CBE) by a caregiver as part of a regular health exam every 1 to 3 years. After age 101, women should have a CBE every year. Starting at age 100, women should consider having a mammogram (breast X-ray) every year. Women who have a family history of breast cancer should talk to their caregiver about genetic screening. Women at a high risk of breast cancer should talk to their caregiver about having an MRI and a mammogram every year.  Breast cancer gene (BRCA)-related cancer risk assessment is recommended for women who have family members with BRCA-related cancers. BRCA-related cancers include breast, ovarian, tubal, and peritoneal cancers. Having family members with these cancers may be associated with an increased risk for harmful changes (mutations) in the breast cancer genes BRCA1 and BRCA2. Results of the assessment will determine the need for genetic counseling and BRCA1 and BRCA2 testing.  The Pap test is a screening test for cervical cancer. Women should have a Pap test starting at age 57. Between ages 25 and 35, Pap tests should be repeated every 2 years. Beginning at age 37, you should have a Pap test every 3 years as long as the past 3 Pap tests have been normal. If you had a hysterectomy for a problem that was not cancer or a condition that could lead to cancer, then you no longer need Pap tests. If you are  between ages 50 and 76, and you have had normal Pap tests going back 10 years, you no longer need Pap tests. If you have had past treatment for cervical cancer or a condition that could lead to cancer, you need Pap tests and screening for cancer for at least 20 years after your treatment. If Pap tests have been discontinued, risk factors (such as a new sexual partner) need to be reassessed to determine if screening should be resumed. Some women have medical problems that increase the chance of getting cervical cancer. In these cases, your caregiver may recommend more frequent screening and Pap tests.  The human papillomavirus (HPV) test is an additional test that may be used for cervical cancer screening. The HPV test looks for the virus that can cause the cell changes on the cervix. The cells collected during the Pap test can be tested for HPV. The HPV test could be used to screen women aged 44 years and older, and should be used in women of any age  who have unclear Pap test results. After the age of 55, women should have HPV testing at the same frequency as a Pap test.  Colorectal cancer can be detected and often prevented. Most routine colorectal cancer screening begins at the age of 44 and continues through age 20. However, your caregiver may recommend screening at an earlier age if you have risk factors for colon cancer. On a yearly basis, your caregiver may provide home test kits to check for hidden blood in the stool. Use of a small camera at the end of a tube, to directly examine the colon (sigmoidoscopy or colonoscopy), can detect the earliest forms of colorectal cancer. Talk to your caregiver about this at age 86, when routine screening begins. Direct examination of the colon should be repeated every 5 to 10 years through age 13, unless early forms of pre-cancerous polyps or small growths are found.  Hepatitis C blood testing is recommended for all people born from [birth year] through 1965 and any  individual with known risks for hepatitis C.  Practice safe sex. Use condoms and avoid high-risk sexual practices to reduce the spread of sexually transmitted infections (STIs). Sexually active women aged 36 and younger should be checked for Chlamydia, which is a common sexually transmitted infection. Older women with new or multiple partners should also be tested for Chlamydia. Testing for other STIs is recommended if you are sexually active and at increased risk.  Osteoporosis is a disease in which the bones lose minerals and strength with aging. This can result in serious bone fractures. The risk of osteoporosis can be identified using a bone density scan. Women ages 20 and over and women at risk for fractures or osteoporosis should discuss screening with their caregivers. Ask your caregiver whether you should be taking a calcium supplement or vitamin D to reduce the rate of osteoporosis.  Menopause can be associated with physical symptoms and risks. Hormone replacement therapy is available to decrease symptoms and risks. You should talk to your caregiver about whether hormone replacement therapy is right for you.  Use sunscreen. Apply sunscreen liberally and repeatedly throughout the day. You should seek shade when your shadow is shorter than you. Protect yourself by wearing long sleeves, pants, a wide-brimmed hat, and sunglasses year round, whenever you are outdoors.  Notify your caregiver of new moles or changes in moles, especially if there is a change in shape or color. Also notify your caregiver if a mole is larger than the size of a pencil eraser.  Stay current with your immunizations. Document Released: 11/24/2010 Document Revised: 09/05/2012 Document Reviewed: 11/24/2010 [facility] Patient Information 2014 [employer].

## 2014-11-13 NOTE — Progress Notes (Signed)
CARDELIA SASSANO 1955-06-30 [id number]        59 y.o.  [name] for annual exam.  Several issues noted below.  Past medical history,surgical history, problem list, medications, allergies, family history and social history were all reviewed and documented as reviewed in the EPIC chart.  ROS:  Performed with pertinent positives and negatives included in the history, assessment and plan.   Additional significant findings :  none   Exam: Kim Counsellor Vitals:   11/13/14 1558  BP: 122/76  Height: 5\' 5"  (1.651 m)  Weight: 152 lb (68.947 kg)   General appearance:  Normal affect, orientation and appearance. Skin: Grossly normal HEENT: Without gross lesions.  No cervical or supraclavicular adenopathy. Thyroid normal.  Lungs:  Clear without wheezing, rales or rhonchi Cardiac: RR, without RMG Abdominal:  Soft, nontender, without masses, guarding, rebound, organomegaly or hernia Breasts:  Examined lying and sitting without masses, retractions, discharge or axillary adenopathy. Pelvic:  Ext/BUS/vagina with atrophic changes  Cervix with atrophic changes. Pap smear/HPV done  Uterus anteverted, normal size, shape and contour, midline and mobile nontender   Adnexa  Without masses or tenderness    Anus and perineum  Normal   Rectovaginal  Normal sphincter tone without palpated masses or tenderness.    Assessment/Plan:  59 y.o. G41P1001 female for annual exam.   1. Postmenopausal/atrophic genital changes.  Had been on HRT but weaned off of this earlier this year. Has done well with some mild hot flashes but otherwise tolerating this. Is going to try OTC products such as soy. Does not want to go back on HRT. No bleeding. We'll continue to monitor and report any bleeding. 2. History of osteoporosis. T score -2.8 in 2013. Transiently took Fosamax for 5-6 months but had a reaction after several months and stopped it. Repeat DEXA 2015 T score -2.4 with improvement. Check vitamin D level today.  Continue on extra calcium and will plan on repeating her DEXA next year at two-year interval. We'll also check a baseline comprehensive metabolic panel and parathyroid hormone. 3. 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 Repeat Pap smear today with HPV.  No history of abnormal Pap smears previously. 4. Mammography 07/2014. Continue with annual mammography. SBE monthly reviewed. 5. Colonoscopy 2011. Her peak at their recommended interval. 6. Health maintenance. Baseline CBC, comprehensive metabolic panel, lipid profile, TSH ,vitamin D, parathyroid urinalysis ordered.  Follow up in one year, sooner as needed.     [name], 4:46 PM 11/13/2014

## 2014-11-14 LAB — PARATHYROID HORMONE, INTACT (NO CA): PTH: 38 pg/mL (ref 14–64)

## 2014-11-14 LAB — VITAMIN D 25 HYDROXY (VIT D DEFICIENCY, FRACTURES): Vit D, 25-Hydroxy: 50 ng/mL (ref 30–100)

## 2014-11-14 LAB — URINALYSIS W MICROSCOPIC + REFLEX CULTURE
Bacteria, UA: NONE SEEN
Bilirubin Urine: NEGATIVE
CASTS: NONE SEEN
Crystals: NONE SEEN
GLUCOSE, UA: NEGATIVE mg/dL
Hgb urine dipstick: NEGATIVE
Ketones, ur: NEGATIVE mg/dL
Leukocytes, UA: NEGATIVE
Nitrite: NEGATIVE
PH: 6.5 (ref 5.0–8.0)
Protein, ur: NEGATIVE mg/dL
SQUAMOUS EPITHELIAL / LPF: NONE SEEN
Specific Gravity, Urine: 1.025 (ref 1.005–1.030)
Urobilinogen, UA: 0.2 mg/dL (ref 0.0–1.0)

## 2014-11-16 LAB — CYTOLOGY - PAP

## 2014-11-20 ENCOUNTER — Telehealth: Payer: Self-pay | Admitting: *Deleted

## 2014-11-20 NOTE — Telephone Encounter (Signed)
Pt aware of lab results on 11/13/14 OV.

## 2015-05-06 ENCOUNTER — Telehealth: Payer: Self-pay | Admitting: *Deleted

## 2015-05-06 NOTE — Telephone Encounter (Signed)
Pt called c/o left breast pain, i called pt back and told her OV better, transferred to front desk

## 2015-05-08 ENCOUNTER — Ambulatory Visit (INDEPENDENT_AMBULATORY_CARE_PROVIDER_SITE_OTHER): Payer: Managed Care, Other (non HMO) | Admitting: Gynecology

## 2015-05-08 ENCOUNTER — Encounter: Payer: Self-pay | Admitting: Gynecology

## 2015-05-08 VITALS — BP 120/74

## 2015-05-08 DIAGNOSIS — N644 Mastodynia: Secondary | ICD-10-CM | POA: Diagnosis not present

## 2015-05-08 NOTE — Patient Instructions (Signed)
Office will call you to arrange the mammogram and ultrasound.  Call the office if you do not hear from them within 1 week.

## 2015-05-08 NOTE — Progress Notes (Signed)
Sandra Morales 1955/09/29 [id number]        59 y.o.  [name] presents with a two-month history of left breast pain. Patient notes in the tail of Spence region aching but comes and goes. It will wake her up at night. She did have some left nipple itching previously but this is resolved. No discharge. No masses on self-exam. Last mammogram normal March 2016.  Past medical history,surgical history, problem list, medications, allergies, family history and social history were all reviewed and documented in the EPIC chart.  Directed ROS with pertinent positives and negatives documented in the history of present illness/assessment and plan.  Exam: [name] Vitals:   05/08/15 0802  BP: 120/74   General appearance:  Normal Both breast examined lying and sitting without masses, retractions, discharge, adenopathy.  Assessment/Plan:  59 y.o. [name] with history of left breast mastalgia with normal exam. Recommend diagnostic mammography and ultrasound. Patient knows we will call to arrange this but if she does not hear from [place] within one week she will call us back. Assuming studies negative we'll plan expectant management for now. If discomfort continues she'll represent for reevaluation/discussion of treatment options. Assuming discomfort resolves then will follow.    [name], 8:15 AM 05/08/2015

## 2015-05-22 ENCOUNTER — Telehealth: Payer: Self-pay | Admitting: *Deleted

## 2015-05-22 NOTE — Telephone Encounter (Signed)
Per Dr.Fontaine note on OV 05/08/15 "left breast mastalgia with normal exam" appointment will be scheduled at [facility] on 06/06/15 @ 8:45am pt aware faxed

## 2015-06-07 ENCOUNTER — Encounter: Payer: Self-pay | Admitting: Gynecology

## 2015-11-04 ENCOUNTER — Encounter: Payer: Self-pay | Admitting: Gynecology

## 2015-11-04 ENCOUNTER — Telehealth: Payer: Self-pay | Admitting: *Deleted

## 2015-11-04 ENCOUNTER — Ambulatory Visit (INDEPENDENT_AMBULATORY_CARE_PROVIDER_SITE_OTHER): Payer: Managed Care, Other (non HMO) | Admitting: Gynecology

## 2015-11-04 VITALS — BP 118/76

## 2015-11-04 DIAGNOSIS — N907 Vulvar cyst: Secondary | ICD-10-CM

## 2015-11-04 NOTE — Patient Instructions (Signed)
Follow up for your annual exam when scheduled

## 2015-11-04 NOTE — Progress Notes (Signed)
    Sandra Morales 1955-12-01 [id number]        60 y.o.  [name] presents with a three-week history of vulvar tender area. Initially just noticed it as a bump but then became a little tender and now with wiping she notices a little bit of blood on tissue. No urinary symptoms such as frequency dysuria or urgency. No low back pain, fever or chills. No vaginal discharge or itching or odor. No history of same before.  Past medical history,surgical history, problem list, medications, allergies, family history and social history were all reviewed and documented in the EPIC chart.  Directed ROS with pertinent positives and negatives documented in the history of present illness/assessment and plan.  Exam: [name] assistant Filed Vitals:   11/04/15 1450  BP: 118/76   General appearance:  Normal Abdomen soft nontender without masses guarding rebound Pelvic external BUS vagina with small sebaceous cyst mid left labium minora. Pointing and with pressure extruded sebaceous material. Vagina atrophic without abnormalities.  Cervix atrophic. Uterus normal size midline mobile nontender. Adnexa without masses or tenderness  Assessment/Plan:  60 y.o. [name] with small sebaceous cyst left labium minora. Now drained. Reassured patient that it will heal quickly. She does have her annual exam scheduled in 2 weeks will follow up for this.    [name], 2:58 PM 11/04/2015

## 2015-11-04 NOTE — Telephone Encounter (Signed)
Pt called c/o knot in vagina has been there x 3 weeks, no pain. Pt asked what should she do,i advised pt to schedule OV with provider to exam, transferred to front desk.

## 2015-11-20 ENCOUNTER — Ambulatory Visit (INDEPENDENT_AMBULATORY_CARE_PROVIDER_SITE_OTHER): Payer: Managed Care, Other (non HMO) | Admitting: Gynecology

## 2015-11-20 ENCOUNTER — Encounter: Payer: Self-pay | Admitting: Gynecology

## 2015-11-20 VITALS — BP 120/74 | Ht 65.0 in | Wt 160.0 lb

## 2015-11-20 DIAGNOSIS — N952 Postmenopausal atrophic vaginitis: Secondary | ICD-10-CM | POA: Diagnosis not present

## 2015-11-20 DIAGNOSIS — Z01419 Encounter for gynecological examination (general) (routine) without abnormal findings: Secondary | ICD-10-CM | POA: Diagnosis not present

## 2015-11-20 DIAGNOSIS — M810 Age-related osteoporosis without current pathological fracture: Secondary | ICD-10-CM

## 2015-11-20 DIAGNOSIS — R87810 Cervical high risk human papillomavirus (HPV) DNA test positive: Secondary | ICD-10-CM | POA: Diagnosis not present

## 2015-11-20 DIAGNOSIS — Z13220 Encounter for screening for lipoid disorders: Secondary | ICD-10-CM

## 2015-11-20 LAB — LIPID PANEL
CHOL/HDL RATIO: 2.8 ratio (ref <=5.0)
Cholesterol: 165 mg/dL (ref 125–200)
HDL: 59 mg/dL (ref >=46)
LDL CALC: 91 mg/dL (ref <130)
TRIGLYCERIDES: 74 mg/dL (ref <150)
VLDL: 15 mg/dL (ref <30)

## 2015-11-20 LAB — COMPREHENSIVE METABOLIC PANEL
ALBUMIN: 4.3 g/dL (ref 3.6–5.1)
ALT: 43 U/L — ABNORMAL HIGH (ref 6–29)
AST: 26 U/L (ref 10–35)
Alkaline Phosphatase: 82 U/L (ref 33–130)
BUN: 13 mg/dL (ref 7–25)
CALCIUM: 9.2 mg/dL (ref 8.6–10.4)
CHLORIDE: 104 mmol/L (ref 98–110)
CO2: 25 mmol/L (ref 20–31)
CREATININE: 0.67 mg/dL (ref 0.50–1.05)
Glucose, Bld: 106 mg/dL — ABNORMAL HIGH (ref 65–99)
POTASSIUM: 3.8 mmol/L (ref 3.5–5.3)
SODIUM: 140 mmol/L (ref 135–146)
Total Bilirubin: 0.2 mg/dL (ref 0.2–1.2)
Total Protein: 6.3 g/dL (ref 6.1–8.1)

## 2015-11-20 LAB — CBC WITH DIFFERENTIAL/PLATELET
Basophils Absolute: 66 cells/uL (ref 0–200)
Basophils Relative: 1 %
EOS PCT: 2 %
Eosinophils Absolute: 132 cells/uL (ref 15–500)
HCT: 36.2 % (ref 35.0–45.0)
HEMOGLOBIN: 12.2 g/dL (ref 11.7–15.5)
LYMPHS ABS: 2178 {cells}/uL (ref 850–3900)
Lymphocytes Relative: 33 %
MCH: 28.3 pg (ref 27.0–33.0)
MCHC: 33.7 g/dL (ref 32.0–36.0)
MCV: 84 fL (ref 80.0–100.0)
MPV: 10 fL (ref 7.5–12.5)
Monocytes Absolute: 462 cells/uL (ref 200–950)
Monocytes Relative: 7 %
NEUTROS ABS: 3762 {cells}/uL (ref 1500–7800)
Neutrophils Relative %: 57 %
Platelets: 222 10*3/uL (ref 140–400)
RBC: 4.31 MIL/uL (ref 3.80–5.10)
RDW: 12.7 % (ref 11.0–15.0)
WBC: 6.6 10*3/uL (ref 3.8–10.8)

## 2015-11-20 LAB — TSH: TSH: 1.59 mIU/L

## 2015-11-20 NOTE — Addendum Note (Signed)
Addended by: Nelva Nay on: 11/20/2015 04:27 PM   Modules accepted: Orders, SmartSet

## 2015-11-20 NOTE — Patient Instructions (Signed)
Follow up for bone density as scheduled.  You may obtain a copy of any labs that were done today by logging onto MyChart as outlined in the instructions provided with your AVS (after visit summary). The office will not call with normal lab results but certainly if there are any significant abnormalities then we will contact you.   Health Maintenance Adopting a healthy lifestyle and getting preventive care can go a long way to promote health and wellness. Talk with your health care provider about what schedule of regular examinations is right for you. This is a good chance for you to check in with your provider about disease prevention and staying healthy. In between checkups, there are plenty of things you can do on your own. Experts have done a lot of research about which lifestyle changes and preventive measures are most likely to keep you healthy. Ask your health care provider for more information. WEIGHT AND DIET  Eat a healthy diet  Be sure to include plenty of vegetables, fruits, low-fat dairy products, and lean protein.  Do not eat a lot of foods high in solid fats, added sugars, or salt.  Get regular exercise. This is one of the most important things you can do for your health.  Most adults should exercise for at least 150 minutes each week. The exercise should increase your heart rate and make you sweat (moderate-intensity exercise).  Most adults should also do strengthening exercises at least twice a week. This is in addition to the moderate-intensity exercise.  Maintain a healthy weight  Body mass index (BMI) is a measurement that can be used to identify possible weight problems. It estimates body fat based on height and weight. Your health care provider can help determine your BMI and help you achieve or maintain a healthy weight.  For females 69 years of age and older:   A BMI below 18.5 is considered underweight.  A BMI of 18.5 to 24.9 is normal.  A BMI of 25 to 29.9 is  considered overweight.  A BMI of 30 and above is considered obese.  Watch levels of cholesterol and blood lipids  You should start having your blood tested for lipids and cholesterol at 60 years of age, then have this test every 5 years.  You may need to have your cholesterol levels checked more often if:  Your lipid or cholesterol levels are high.  You are older than 60 years of age.  You are at high risk for heart disease.  CANCER SCREENING   Lung Cancer  Lung cancer screening is recommended for adults 30-82 years old who are at high risk for lung cancer because of a history of smoking.  A yearly low-dose CT scan of the lungs is recommended for people who:  Currently smoke.  Have quit within the past 15 years.  Have at least a 30-pack-year history of smoking. A pack year is smoking an average of one pack of cigarettes a day for 1 year.  Yearly screening should continue until it has been 15 years since you quit.  Yearly screening should stop if you develop a health problem that would prevent you from having lung cancer treatment.  Breast Cancer  Practice breast self-awareness. This means understanding how your breasts normally appear and feel.  It also means doing regular breast self-exams. Let your health care provider know about any changes, no matter how small.  If you are in your 20s or 30s, you should have a clinical breast exam (  CBE) by a health care provider every 1-3 years as part of a regular health exam.  If you are 56 or older, have a CBE every year. Also consider having a breast X-ray (mammogram) every year.  If you have a family history of breast cancer, talk to your health care provider about genetic screening.  If you are at high risk for breast cancer, talk to your health care provider about having an MRI and a mammogram every year.  Breast cancer gene (BRCA) assessment is recommended for women who have family members with BRCA-related cancers.  BRCA-related cancers include:  Breast.  Ovarian.  Tubal.  Peritoneal cancers.  Results of the assessment will determine the need for genetic counseling and BRCA1 and BRCA2 testing. Cervical Cancer Routine pelvic examinations to screen for cervical cancer are no longer recommended for nonpregnant women who are considered low risk for cancer of the pelvic organs (ovaries, uterus, and vagina) and who do not have symptoms. A pelvic examination may be necessary if you have symptoms including those associated with pelvic infections. Ask your health care provider if a screening pelvic exam is right for you.   The Pap test is the screening test for cervical cancer for women who are considered at risk.  If you had a hysterectomy for a problem that was not cancer or a condition that could lead to cancer, then you no longer need Pap tests.  If you are older than 65 years, and you have had normal Pap tests for the past 10 years, you no longer need to have Pap tests.  If you have had past treatment for cervical cancer or a condition that could lead to cancer, you need Pap tests and screening for cancer for at least 20 years after your treatment.  If you no longer get a Pap test, assess your risk factors if they change (such as having a new sexual partner). This can affect whether you should start being screened again.  Some women have medical problems that increase their chance of getting cervical cancer. If this is the case for you, your health care provider may recommend more frequent screening and Pap tests.  The human papillomavirus (HPV) test is another test that may be used for cervical cancer screening. The HPV test looks for the virus that can cause cell changes in the cervix. The cells collected during the Pap test can be tested for HPV.  The HPV test can be used to screen women 39 years of age and older. Getting tested for HPV can extend the interval between normal Pap tests from three to  five years.  An HPV test also should be used to screen women of any age who have unclear Pap test results.  After 60 years of age, women should have HPV testing as often as Pap tests.  Colorectal Cancer  This type of cancer can be detected and often prevented.  Routine colorectal cancer screening usually begins at 60 years of age and continues through 60 years of age.  Your health care provider may recommend screening at an earlier age if you have risk factors for colon cancer.  Your health care provider may also recommend using home test kits to check for hidden blood in the stool.  A small camera at the end of a tube can be used to examine your colon directly (sigmoidoscopy or colonoscopy). This is done to check for the earliest forms of colorectal cancer.  Routine screening usually begins at age 77.  Direct examination of the colon should be repeated every 5-10 years through 60 years of age. However, you may need to be screened more often if early forms of precancerous polyps or small growths are found. Skin Cancer  Check your skin from head to toe regularly.  Tell your health care provider about any new moles or changes in moles, especially if there is a change in a mole's shape or color.  Also tell your health care provider if you have a mole that is larger than the size of a pencil eraser.  Always use sunscreen. Apply sunscreen liberally and repeatedly throughout the day.  Protect yourself by wearing long sleeves, pants, a wide-brimmed hat, and sunglasses whenever you are outside. HEART DISEASE, DIABETES, AND HIGH BLOOD PRESSURE   Have your blood pressure checked at least every 1-2 years. High blood pressure causes heart disease and increases the risk of stroke.  If you are between 76 years and 55 years old, ask your health care provider if you should take aspirin to prevent strokes.  Have regular diabetes screenings. This involves taking a blood sample to check your  fasting blood sugar level.  If you are at a normal weight and have a low risk for diabetes, have this test once every three years after 60 years of age.  If you are overweight and have a high risk for diabetes, consider being tested at a younger age or more often. PREVENTING INFECTION  Hepatitis B  If you have a higher risk for hepatitis B, you should be screened for this virus. You are considered at high risk for hepatitis B if:  You were born in a country where hepatitis B is common. Ask your health care provider which countries are considered high risk.  Your parents were born in a high-risk country, and you have not been immunized against hepatitis B (hepatitis B vaccine).  You have HIV or AIDS.  You use needles to inject street drugs.  You live with someone who has hepatitis B.  You have had sex with someone who has hepatitis B.  You get hemodialysis treatment.  You take certain medicines for conditions, including cancer, organ transplantation, and autoimmune conditions. Hepatitis C  Blood testing is recommended for:  Everyone born from [birth year] through 1965.  Anyone with known risk factors for hepatitis C. Sexually transmitted infections (STIs)  You should be screened for sexually transmitted infections (STIs) including gonorrhea and chlamydia if:  You are sexually active and are younger than 60 years of age.  You are older than 60 years of age and your health care provider tells you that you are at risk for this type of infection.  Your sexual activity has changed since you were last screened and you are at an increased risk for chlamydia or gonorrhea. Ask your health care provider if you are at risk.  If you do not have HIV, but are at risk, it may be recommended that you take a prescription medicine daily to prevent HIV infection. This is called pre-exposure prophylaxis (PrEP). You are considered at risk if:  You are sexually active and do not regularly use condoms or  know the HIV status of your partner(s).  You take drugs by injection.  You are sexually active with a partner who has HIV. Talk with your health care provider about whether you are at high risk of being infected with HIV. If you choose to begin PrEP, you should first be tested for HIV. You should then be  tested every 3 months for as long as you are taking PrEP.  PREGNANCY   If you are premenopausal and you may become pregnant, ask your health care provider about preconception counseling.  If you may become pregnant, take 400 to 800 micrograms (mcg) of folic acid every day.  If you want to prevent pregnancy, talk to your health care provider about birth control (contraception). OSTEOPOROSIS AND MENOPAUSE   Osteoporosis is a disease in which the bones lose minerals and strength with aging. This can result in serious bone fractures. Your risk for osteoporosis can be identified using a bone density scan.  If you are 25 years of age or older, or if you are at risk for osteoporosis and fractures, ask your health care provider if you should be screened.  Ask your health care provider whether you should take a calcium or vitamin D supplement to lower your risk for osteoporosis.  Menopause may have certain physical symptoms and risks.  Hormone replacement therapy may reduce some of these symptoms and risks. Talk to your health care provider about whether hormone replacement therapy is right for you.  HOME CARE INSTRUCTIONS   Schedule regular health, dental, and eye exams.  Stay current with your immunizations.   Do not use any tobacco products including cigarettes, chewing tobacco, or electronic cigarettes.  If you are pregnant, do not drink alcohol.  If you are breastfeeding, limit how much and how often you drink alcohol.  Limit alcohol intake to no more than 1 drink per day for nonpregnant women. One drink equals 12 ounces of beer, 5 ounces of wine, or 1 ounces of hard liquor.  Do  not use street drugs.  Do not share needles.  Ask your health care provider for help if you need support or information about quitting drugs.  Tell your health care provider if you often feel depressed.  Tell your health care provider if you have ever been abused or do not feel safe at home. Document Released: 11/24/2010 Document Revised: 09/25/2013 Document Reviewed: 04/12/2013 [facility] Patient Information 2015 [place], [place]. This information is not intended to replace advice given to you by your health care provider. Make sure you discuss any questions you have with your health care provider.

## 2015-11-20 NOTE — Progress Notes (Signed)
    Sandra Morales 1956/01/31 [id number]        60 y.o.  [name]  for annual exam.  Several issues noted below.  Past medical history,surgical history, problem list, medications, allergies, family history and social history were all reviewed and documented as reviewed in the EPIC chart.  ROS:  Performed with pertinent positives and negatives included in the history, assessment and plan.   Additional significant findings :  None   Exam: [name] assistant Filed Vitals:   11/20/15 1551  BP: 120/74  Height: 5\' 5"  (1.651 m)  Weight: 160 lb (72.576 kg)   General appearance:  Normal affect, orientation and appearance. Skin: Grossly normal HEENT: Without gross lesions.  No cervical or supraclavicular adenopathy. Thyroid normal.  Lungs:  Clear without wheezing, rales or rhonchi Cardiac: RR, without RMG Abdominal:  Soft, nontender, without masses, guarding, rebound, organomegaly or hernia Breasts:  Examined lying and sitting without masses, retractions, discharge or axillary adenopathy. Pelvic:  Ext/BUS/vagina with atrophic changes  Cervix with atrophic changes. Pap smear/HPV  Uterus anteverted, normal size, shape and contour, midline and mobile nontender   Adnexa without masses or tenderness    Anus and perineum normal   Rectovaginal normal sphincter tone without palpated masses or tenderness.    Assessment/Plan:  60 y.o. G48P1001 female for annual exam.   1. Postmenopausal/atrophic genital changes. Without significant hot flushes, night sweats, vaginal dryness or any vaginal bleeding. Continue to monitor report any issues or vaginal bleeding. 2. Osteoporosis. T score -2.8 and 2013. Transanally took Fosamax 5-6 months but stopped secondary to side effects. Follow up DEXA 2015 T score -2.4. Schedule DEXA now a 2 year interval. Check vitamin D level. 3. 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 Negative subtype 16, 18/45. Normal cytology. Repeat Pap smear 2016 negative for both cytology  and HPV. Pap smear/HPV today. If continues negatives then plan less frequent screening intervals. 4. Mammography 05/2015. Continue with annual mammography when due. SBE monthly reviewed. 5. Colonoscopy 2011. Reported repeat interval 10 years. 6. Health maintenance. Baseline CBC, CMP, lipid profile, TSH, vitamin D, urinalysis ordered. Follow up forward bone density otherwise 1 year, sooner as needed.   [name], 4:16 PM 11/20/2015

## 2015-11-21 ENCOUNTER — Other Ambulatory Visit: Payer: Self-pay | Admitting: Gynecology

## 2015-11-21 DIAGNOSIS — R7989 Other specified abnormal findings of blood chemistry: Secondary | ICD-10-CM

## 2015-11-21 DIAGNOSIS — R945 Abnormal results of liver function studies: Secondary | ICD-10-CM

## 2015-11-21 DIAGNOSIS — R7309 Other abnormal glucose: Secondary | ICD-10-CM

## 2015-11-21 LAB — VITAMIN D 25 HYDROXY (VIT D DEFICIENCY, FRACTURES): Vit D, 25-Hydroxy: 41 ng/mL (ref 30–100)

## 2015-11-21 LAB — URINALYSIS W MICROSCOPIC + REFLEX CULTURE
Bilirubin Urine: NEGATIVE
Casts: NONE SEEN [LPF]
Crystals: NONE SEEN [HPF]
GLUCOSE, UA: NEGATIVE
HGB URINE DIPSTICK: NEGATIVE
KETONES UR: NEGATIVE
LEUKOCYTES UA: NEGATIVE
NITRITE: NEGATIVE
PH: 6.5 (ref 5.0–8.0)
Protein, ur: NEGATIVE
Specific Gravity, Urine: 1.022 (ref 1.001–1.035)
WBC, UA: NONE SEEN WBC/HPF (ref <=5)
Yeast: NONE SEEN [HPF]

## 2015-11-22 ENCOUNTER — Encounter: Payer: Self-pay | Admitting: Gynecology

## 2015-11-22 LAB — URINE CULTURE

## 2015-11-22 LAB — PAP IG AND HPV HIGH-RISK: HPV DNA High Risk: NOT DETECTED

## 2016-01-21 ENCOUNTER — Encounter: Payer: Self-pay | Admitting: Gynecology

## 2016-01-21 ENCOUNTER — Other Ambulatory Visit: Payer: Managed Care, Other (non HMO)

## 2016-01-21 ENCOUNTER — Ambulatory Visit (INDEPENDENT_AMBULATORY_CARE_PROVIDER_SITE_OTHER): Payer: Managed Care, Other (non HMO)

## 2016-01-21 DIAGNOSIS — M810 Age-related osteoporosis without current pathological fracture: Secondary | ICD-10-CM | POA: Diagnosis not present

## 2016-01-21 LAB — COMPREHENSIVE METABOLIC PANEL
ALT: 35 U/L — ABNORMAL HIGH (ref 6–29)
AST: 18 U/L (ref 10–35)
Albumin: 4.1 g/dL (ref 3.6–5.1)
Alkaline Phosphatase: 75 U/L (ref 33–130)
BILIRUBIN TOTAL: 0.5 mg/dL (ref 0.2–1.2)
BUN: 15 mg/dL (ref 7–25)
CHLORIDE: 106 mmol/L (ref 98–110)
CO2: 30 mmol/L (ref 20–31)
CREATININE: 0.71 mg/dL (ref 0.50–1.05)
Calcium: 9.3 mg/dL (ref 8.6–10.4)
Glucose, Bld: 99 mg/dL (ref 65–99)
Potassium: 4.5 mmol/L (ref 3.5–5.3)
SODIUM: 143 mmol/L (ref 135–146)
TOTAL PROTEIN: 6.3 g/dL (ref 6.1–8.1)

## 2016-01-21 LAB — HEMOGLOBIN A1C
HEMOGLOBIN A1C: 5.2 % (ref <5.7)
Mean Plasma Glucose: 103 mg/dL

## 2016-01-22 ENCOUNTER — Encounter: Payer: Self-pay | Admitting: Gynecology

## 2016-01-31 ENCOUNTER — Telehealth: Payer: Self-pay | Admitting: *Deleted

## 2016-01-31 NOTE — Telephone Encounter (Signed)
Pt informed with the result noted on my chart e-mail sent by Dr.Fontaine on 01/22/16.

## 2016-02-14 ENCOUNTER — Ambulatory Visit (INDEPENDENT_AMBULATORY_CARE_PROVIDER_SITE_OTHER): Payer: Managed Care, Other (non HMO) | Admitting: Podiatry

## 2016-02-14 ENCOUNTER — Ambulatory Visit (INDEPENDENT_AMBULATORY_CARE_PROVIDER_SITE_OTHER): Payer: Managed Care, Other (non HMO)

## 2016-02-14 ENCOUNTER — Encounter: Payer: Self-pay | Admitting: Podiatry

## 2016-02-14 VITALS — BP 161/89 | HR 82 | Resp 18

## 2016-02-14 DIAGNOSIS — M779 Enthesopathy, unspecified: Secondary | ICD-10-CM

## 2016-02-14 DIAGNOSIS — M7741 Metatarsalgia, right foot: Secondary | ICD-10-CM | POA: Diagnosis not present

## 2016-02-14 DIAGNOSIS — R52 Pain, unspecified: Secondary | ICD-10-CM

## 2016-02-14 MED ORDER — MELOXICAM 7.5 MG PO TABS: 7.5000 mg | ORAL_TABLET | Freq: Every day | ORAL | 0 refills | Status: DC

## 2016-02-14 NOTE — Progress Notes (Signed)
   Subjective:    Patient ID: Sandra Morales, female    DOB: 10-Sep-1955, 60 y.o.   MRN: [id number]  HPI  60 year old female presents the office of consent the right foot pain. She states that she is walking she feels that she is walking on a bruise to the bottom of her right foot on the ball the foot along the padding. She states that she's had a callus over this. She has tried over-the-counter callus remover. She denies any recent injury or trauma. Some occasional numbness to the toes however intermittent. No other treatment. No other complaints.   Review of Systems  Neurological: Positive for dizziness.       Objective:   Physical Exam General: AAO x3, NAD  Dermatological: Skin is warm, dry and supple bilateral. Nails x 10 are well manicured; remaining integument appears unremarkable at this time. There are no open sores, no preulcerative lesions, no rash or signs of infection present.  Vascular: Dorsalis Pedis artery and Posterior Tibial artery pedal pulses are 2/4 bilateral with immedate capillary fill time. Pedal hair growth present. No varicosities and no lower extremity edema present bilateral. There is no pain with calf compression, swelling, warmth, erythema.   Neruologic: Grossly intact via light touch bilateral. Vibratory intact via tuning fork bilateral. Protective threshold with Semmes Wienstein monofilament intact to all pedal sites bilateral. Patellar and Achilles deep tendon reflexes 2+ bilateral. No Babinski or clonus noted bilateral.   Musculoskeletal: Right foot submetatarsal screws localized area of edema without any erythema or increase in warmth. There is no fluctuance or crepitus and there is no open sores identified. There is no direct area pinpoint bony tenderness or pain the vibratory sensation. No pain with MPJ range of motion. This prominence the metatarsal heads plantarly with atrophy of the fat pad. Muscular strength 5/5 in all groups tested bilateral.  Gait:  Unassisted, Nonantalgic.      Assessment & Plan:  Submetatarsal 2 edema, capsulitis, metatarsalgia -Treatment options discussed including all alternatives, risks, and complications -Etiology of symptoms were discussed -X-rays were obtained and reviewed with the patient. No evidence of acute fracture. -Discussed steroid injection to the area she wishes to proceed. Under sterile conditions a mixture of dexamethasone and  local anesthetic was infiltrated without complications. Post injection care was discussed. -Offloading has her dispensed -Long-term discussed custom orthotics. Also discussed shoe gear modifications. -Follow-up in 3 weeks or sooner if any problems arise. In the meantime, encouraged to call the office with any questions, concerns, change in symptoms.   Celesta Gentile, DPM

## 2016-02-21 ENCOUNTER — Ambulatory Visit: Payer: Managed Care, Other (non HMO) | Admitting: Podiatry

## 2016-03-09 ENCOUNTER — Ambulatory Visit: Payer: Managed Care, Other (non HMO) | Admitting: Podiatry

## 2016-07-29 ENCOUNTER — Encounter: Payer: Self-pay | Admitting: Gynecology

## 2016-09-09 ENCOUNTER — Ambulatory Visit (INDEPENDENT_AMBULATORY_CARE_PROVIDER_SITE_OTHER): Payer: Managed Care, Other (non HMO) | Admitting: Podiatry

## 2016-09-09 ENCOUNTER — Encounter: Payer: Self-pay | Admitting: Podiatry

## 2016-09-09 ENCOUNTER — Ambulatory Visit (INDEPENDENT_AMBULATORY_CARE_PROVIDER_SITE_OTHER): Payer: Managed Care, Other (non HMO)

## 2016-09-09 DIAGNOSIS — S99922A Unspecified injury of left foot, initial encounter: Secondary | ICD-10-CM

## 2016-09-09 DIAGNOSIS — M779 Enthesopathy, unspecified: Secondary | ICD-10-CM

## 2016-09-09 MED ORDER — TRIAMCINOLONE ACETONIDE 10 MG/ML IJ SUSP
10.0000 mg | Freq: Once | INTRAMUSCULAR | Status: AC
  Administered 2016-09-09: 10 mg

## 2016-09-09 NOTE — Progress Notes (Signed)
Subjective:     Patient ID: Sandra Morales, female   DOB: 17-Sep-1955, 61 y.o.   MRN: [id number]  HPI patient states she still has some swelling and discomfort around the right plantar foot and also has had trauma to the left fourth toe was possibility for fracture   Review of Systems     Objective:   Physical Exam Neurovascular status intact with patient found to have quite a bit of swelling in the plantar metatarsal right around the second metatarsal with keratotic tissue formation that's mild in nature with moderate pain and is noted to have edema in discomfort fourth toe left    Assessment:     Appears to be inflammatory capsulitis right with possibility that this could be a flexor plate tear or get dislocation along with traumatized left toe    Plan:     H&P and x-rays of both feet reviewed. At this point for the right to try to reduce the fluid and I did do a proximal nerve block 60 mg I can Marcaine and then aspirated the second MPJ getting out of small amount of fluid and injected with a quarter cc deck some Kenalog and for the left I do not recommend treatment as is no indication of fracture and it should heal uneventfully  X-ray report indicates no indications of fracture left fourth toe and the right foot has slight edema on soft tissue but I did not note any bone issues

## 2016-09-23 ENCOUNTER — Other Ambulatory Visit: Payer: Self-pay | Admitting: Family Medicine

## 2016-09-23 ENCOUNTER — Ambulatory Visit
Admission: RE | Admit: 2016-09-23 | Discharge: 2016-09-23 | Disposition: A | Discharge status: Home / Self Care | Payer: Managed Care, Other (non HMO) | Source: Ambulatory Visit | Attending: Family Medicine | Admitting: Family Medicine

## 2016-09-23 DIAGNOSIS — R509 Fever, unspecified: Principal | ICD-10-CM

## 2016-09-23 DIAGNOSIS — R05 Cough: Secondary | ICD-10-CM

## 2016-09-23 DIAGNOSIS — R059 Cough, unspecified: Secondary | ICD-10-CM

## 2016-10-27 ENCOUNTER — Telehealth: Payer: Self-pay | Admitting: *Deleted

## 2016-10-27 NOTE — Telephone Encounter (Addendum)
Pt states Dr. Paulla Dolly had wanted a CT or [place], but she can not afford to be out of work for 2 appt and a test, would like the test ordered before appt.10/28/2016-Pt called to see if Dr. Paulla Dolly wanted to get CT or [place] prior to office visit. Dr. Paulla Dolly states only saw pt once in April, will not orders CT or [place] at this time without evaluating again. I informed pt of Dr. Mellody Drown orders and transferred pt to schedulers.

## 2016-11-20 ENCOUNTER — Encounter: Payer: Self-pay | Admitting: Gynecology

## 2016-11-20 ENCOUNTER — Ambulatory Visit (INDEPENDENT_AMBULATORY_CARE_PROVIDER_SITE_OTHER): Payer: Managed Care, Other (non HMO) | Admitting: Gynecology

## 2016-11-20 VITALS — BP 118/74 | Ht 65.0 in | Wt 157.0 lb

## 2016-11-20 DIAGNOSIS — N952 Postmenopausal atrophic vaginitis: Secondary | ICD-10-CM | POA: Diagnosis not present

## 2016-11-20 DIAGNOSIS — Z01411 Encounter for gynecological examination (general) (routine) with abnormal findings: Secondary | ICD-10-CM | POA: Diagnosis not present

## 2016-11-20 DIAGNOSIS — M810 Age-related osteoporosis without current pathological fracture: Secondary | ICD-10-CM | POA: Diagnosis not present

## 2016-11-20 LAB — CBC WITH DIFFERENTIAL/PLATELET
BASOS ABS: 77 {cells}/uL (ref 0–200)
Basophils Relative: 1 %
EOS ABS: 231 {cells}/uL (ref 15–500)
Eosinophils Relative: 3 %
HEMATOCRIT: 38.2 % (ref 35.0–45.0)
Hemoglobin: 12.7 g/dL (ref 11.7–15.5)
LYMPHS PCT: 35 %
Lymphs Abs: 2695 cells/uL (ref 850–3900)
MCH: 28.4 pg (ref 27.0–33.0)
MCHC: 33.2 g/dL (ref 32.0–36.0)
MCV: 85.5 fL (ref 80.0–100.0)
MONO ABS: 539 {cells}/uL (ref 200–950)
MONOS PCT: 7 %
MPV: 10.1 fL (ref 7.5–12.5)
NEUTROS PCT: 54 %
Neutro Abs: 4158 cells/uL (ref 1500–7800)
PLATELETS: 241 10*3/uL (ref 140–400)
RBC: 4.47 MIL/uL (ref 3.80–5.10)
RDW: 13.3 % (ref 11.0–15.0)
WBC: 7.7 10*3/uL (ref 3.8–10.8)

## 2016-11-20 NOTE — Progress Notes (Signed)
    Sandra Morales Jul 24, 1955 [id number]        61 y.o.  [name] for annual exam.    Past medical history,surgical history, problem list, medications, allergies, family history and social history were all reviewed and documented as reviewed in the EPIC chart.  ROS:  Performed with pertinent positives and negatives included in the history, assessment and plan.   Additional significant findings :  None   Exam: [name] assistant Vitals:   11/20/16 1556  BP: 118/74  Weight: 157 lb (71.2 kg)  Height: 5\' 5"  (1.651 m)   Body mass index is 26.13 kg/m.  General appearance:  Normal affect, orientation and appearance. Skin: Grossly normal HEENT: Without gross lesions.  No cervical or supraclavicular adenopathy. Thyroid normal.  Lungs:  Clear without wheezing, rales or rhonchi Cardiac: RR, without RMG Abdominal:  Soft, nontender, without masses, guarding, rebound, organomegaly or hernia Breasts:  Examined lying and sitting without masses, retractions, discharge or axillary adenopathy. Pelvic:  Ext, BUS, Vagina: With atrophic changes  Cervix: With atrophic changes  Uterus: Anteverted, normal size, shape and contour, midline and mobile nontender   Adnexa: Without masses or tenderness    Anus and perineum: Normal   Rectovaginal: Normal sphincter tone without palpated masses or tenderness.    Assessment/Plan:  61 y.o. G30P1001 female for annual exam.   1. Postmenopausal/atrophic genital changes. No significant hot flushes, night sweats, vaginal dryness or any vaginal bleeding. Continue to monitor report any issues or bleeding. 2. Osteoporosis. DEXA 2013 T score -2.8.  Transiently took Fosamax but discontinued secondary to side effects. DEXA 12/2015 T score -2.0. Check vitamin D level today. Plan repeat DEXA next year to year interval. 3. Mammography 07/2016. Continue with annual mammography when due. SBE monthly reviewed. Breast exam normal today. 4. Colonoscopy 2011. Repeat at their  recommended interval. 5. 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 Follow up Pap smears 2016 and 2017 both with normal cytology and negative high-risk HPV's. No Pap smear done today. We'll plan less frequent screening interval per current screening guidelines. 6. Health maintenance. Baseline CBC, CMP TSH vitamin D and urinalysis ordered. Lipid profile excellent last year and not repeated. Follow up in one year, sooner as needed.    [name], 4:27 PM 11/20/2016

## 2016-11-20 NOTE — Patient Instructions (Signed)
Annual exam one year

## 2016-11-21 LAB — COMPREHENSIVE METABOLIC PANEL
ALT: 20 U/L (ref 6–29)
AST: 14 U/L (ref 10–35)
Albumin: 4.3 g/dL (ref 3.6–5.1)
Alkaline Phosphatase: 93 U/L (ref 33–130)
BUN: 13 mg/dL (ref 7–25)
CALCIUM: 9.5 mg/dL (ref 8.6–10.4)
CHLORIDE: 103 mmol/L (ref 98–110)
CO2: 23 mmol/L (ref 20–31)
Creat: 0.73 mg/dL (ref 0.50–0.99)
Glucose, Bld: 104 mg/dL — ABNORMAL HIGH (ref 65–99)
Potassium: 4.3 mmol/L (ref 3.5–5.3)
Sodium: 140 mmol/L (ref 135–146)
Total Bilirubin: 0.3 mg/dL (ref 0.2–1.2)
Total Protein: 6.4 g/dL (ref 6.1–8.1)

## 2016-11-21 LAB — URINALYSIS W MICROSCOPIC + REFLEX CULTURE
Bacteria, UA: NONE SEEN [HPF]
Bilirubin Urine: NEGATIVE
CRYSTALS: NONE SEEN [HPF]
Casts: NONE SEEN [LPF]
Glucose, UA: NEGATIVE
Hgb urine dipstick: NEGATIVE
KETONES UR: NEGATIVE
Nitrite: NEGATIVE
PROTEIN: NEGATIVE
SPECIFIC GRAVITY, URINE: 1.019 (ref 1.001–1.035)
Yeast: NONE SEEN [HPF]
pH: 6 (ref 5.0–8.0)

## 2016-11-21 LAB — VITAMIN D 25 HYDROXY (VIT D DEFICIENCY, FRACTURES): Vit D, 25-Hydroxy: 37 ng/mL (ref 30–100)

## 2016-11-21 LAB — TSH: TSH: 1.43 mIU/L

## 2016-11-22 LAB — URINE CULTURE: ORGANISM ID, BACTERIA: NO GROWTH

## 2016-11-27 ENCOUNTER — Telehealth: Payer: Self-pay

## 2016-11-27 NOTE — Telephone Encounter (Signed)
Patient's My Chart email I sent was returned unread. I called her and explained results/recommendations.   #1 vitamin D is at lower end of normal range. I would add 1000 units of vitamin D3 OTC to whatever she is taking now.  #2 glucose is technically borderline at 104. As a nonfasting I think this is fine. Less than 100 is normal. Greater than 125 is considered elevated

## 2017-01-19 ENCOUNTER — Other Ambulatory Visit: Payer: Self-pay | Admitting: Otolaryngology

## 2017-01-19 DIAGNOSIS — R42 Dizziness and giddiness: Secondary | ICD-10-CM

## 2017-01-21 ENCOUNTER — Ambulatory Visit
Admission: RE | Admit: 2017-01-21 | Discharge: 2017-01-21 | Disposition: A | Discharge status: Home / Self Care | Payer: Managed Care, Other (non HMO) | Source: Ambulatory Visit | Attending: Otolaryngology | Admitting: Otolaryngology

## 2017-01-21 ENCOUNTER — Inpatient Hospital Stay
Admission: RE | Admit: 2017-01-21 | Discharge: 2017-01-21 | Disposition: A | Discharge status: Home / Self Care | Payer: Managed Care, Other (non HMO) | Source: Ambulatory Visit | Attending: Otolaryngology | Admitting: Otolaryngology

## 2017-01-21 DIAGNOSIS — R42 Dizziness and giddiness: Secondary | ICD-10-CM

## 2017-01-21 MED ORDER — GADOBENATE DIMEGLUMINE 529 MG/ML IV SOLN
14.0000 mL | Freq: Once | INTRAVENOUS | Status: AC | PRN
  Administered 2017-01-21: 14 mL via INTRAVENOUS

## 2017-03-26 ENCOUNTER — Other Ambulatory Visit: Payer: Self-pay | Admitting: Otolaryngology

## 2017-11-29 ENCOUNTER — Encounter: Payer: Self-pay | Admitting: Gynecology

## 2017-11-29 ENCOUNTER — Ambulatory Visit (INDEPENDENT_AMBULATORY_CARE_PROVIDER_SITE_OTHER): Payer: Managed Care, Other (non HMO) | Admitting: Gynecology

## 2017-11-29 VITALS — BP 154/78 | Ht 64.0 in | Wt 165.0 lb

## 2017-11-29 DIAGNOSIS — R635 Abnormal weight gain: Secondary | ICD-10-CM

## 2017-11-29 DIAGNOSIS — M8580 Other specified disorders of bone density and structure, unspecified site: Secondary | ICD-10-CM | POA: Diagnosis not present

## 2017-11-29 DIAGNOSIS — Z13220 Encounter for screening for lipoid disorders: Secondary | ICD-10-CM

## 2017-11-29 DIAGNOSIS — Z01419 Encounter for gynecological examination (general) (routine) without abnormal findings: Secondary | ICD-10-CM

## 2017-11-29 DIAGNOSIS — N952 Postmenopausal atrophic vaginitis: Secondary | ICD-10-CM

## 2017-11-29 NOTE — Progress Notes (Signed)
    Sandra Morales 1956/02/03 [id number]        62 y.o.  [name] for annual gynecologic exam.  Complaining of weight gain.  Weight 157 last year and is 165 this year.  She does note dietary indiscretion and lack of exercise.  Had tried weight watchers in the past with good success.  Past medical history,surgical history, problem list, medications, allergies, family history and social history were all reviewed and documented as reviewed in the EPIC chart.  ROS:  Performed with pertinent positives and negatives included in the history, assessment and plan.   Additional significant findings : None   Exam: [name] assistant Vitals:   11/29/17 1601  BP: (!) 154/78  Weight: 165 lb (74.8 kg)  Height: 5\' 4"  (1.626 m)   Body mass index is 28.32 kg/m.  General appearance:  Normal affect, orientation and appearance. Skin: Grossly normal HEENT: Without gross lesions.  No cervical or supraclavicular adenopathy. Thyroid normal.  Lungs:  Clear without wheezing, rales or rhonchi Cardiac: RR, without RMG Abdominal:  Soft, nontender, without masses, guarding, rebound, organomegaly or hernia Breasts:  Examined lying and sitting without masses, retractions, discharge or axillary adenopathy. Pelvic:  Ext, BUS, Vagina: With atrophic changes  Cervix: With atrophic changes.  Uterus: Anteverted, normal size, shape and contour, midline and mobile nontender   Adnexa: Without masses or tenderness    Anus and perineum: Normal   Rectovaginal: Normal sphincter tone without palpated masses or tenderness.    Assessment/Plan:  62 y.o. G94P1001 female for annual gynecologic exam.   1. Postmenopausal/atrophic genital changes.  No significant menopausal symptoms or any vaginal bleeding. 2. Osteoporosis.  DEXA 2013 T score -2.8.  Transiently took Fosamax but discontinued secondary to side effects.  Follow-up DEXA 2017 T score -2.0.  History of marginal level vitamin D levels.  Check vitamin D level today.   Schedule follow-up DEXA now at 2-year interval and patient agrees to do so. 3. Mammography 07/2017.  Continue with annual mammography when due.  Breast exam normal today. 4. Colonoscopy 2011.  Repeat at their recommended interval. 5. 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  Follow-up Pap smears 2016 and 2017 both with normal cytology and negative HPV.  No Pap smear done today.  We will plan follow-up Pap smear/HPV at 5-year interval per current screening guidelines. 6. Health maintenance.  Blood pressure 154/78.  Patient does have a good cuff at home and is going to start monitoring her blood pressure.  If it remains elevated she is going to follow-up with her primary physician.  We also reviewed weight gain issues.  Her lack of exercise and dietary indiscretions.  Strongly recommended patient consider weight watchers program.  Future orders placed for CBC, CMP, lipid profile, TSH and vitamin D.  Patient will return fasting and have this done.  Follow-up for DEXA.  Follow-up for annual exam in 1 year.   [name], 4:48 PM 11/29/2017

## 2017-11-29 NOTE — Patient Instructions (Signed)
Checked at home.  Follow-up with your primary physician if it remains elevated.  Follow-up for bone density this coming fall.

## 2017-12-07 ENCOUNTER — Other Ambulatory Visit: Payer: Managed Care, Other (non HMO)

## 2017-12-08 ENCOUNTER — Other Ambulatory Visit: Payer: Managed Care, Other (non HMO)

## 2017-12-08 DIAGNOSIS — M8580 Other specified disorders of bone density and structure, unspecified site: Secondary | ICD-10-CM

## 2017-12-08 DIAGNOSIS — Z01419 Encounter for gynecological examination (general) (routine) without abnormal findings: Secondary | ICD-10-CM

## 2017-12-08 DIAGNOSIS — R635 Abnormal weight gain: Secondary | ICD-10-CM

## 2017-12-08 DIAGNOSIS — Z13220 Encounter for screening for lipoid disorders: Secondary | ICD-10-CM

## 2017-12-09 ENCOUNTER — Other Ambulatory Visit: Payer: Self-pay | Admitting: Gynecology

## 2017-12-09 DIAGNOSIS — R7309 Other abnormal glucose: Secondary | ICD-10-CM

## 2017-12-09 LAB — CBC WITH DIFFERENTIAL/PLATELET
BASOS ABS: 31 {cells}/uL (ref 0–200)
Basophils Relative: 0.6 %
EOS ABS: 82 {cells}/uL (ref 15–500)
Eosinophils Relative: 1.6 %
HCT: 36.5 % (ref 35.0–45.0)
Hemoglobin: 12.7 g/dL (ref 11.7–15.5)
Lymphs Abs: 1785 cells/uL (ref 850–3900)
MCH: 28.9 pg (ref 27.0–33.0)
MCHC: 34.8 g/dL (ref 32.0–36.0)
MCV: 83.1 fL (ref 80.0–100.0)
MONOS PCT: 5.5 %
MPV: 10.9 fL (ref 7.5–12.5)
Neutro Abs: 2922 cells/uL (ref 1500–7800)
Neutrophils Relative %: 57.3 %
Platelets: 192 10*3/uL (ref 140–400)
RBC: 4.39 10*6/uL (ref 3.80–5.10)
RDW: 11.8 % (ref 11.0–15.0)
TOTAL LYMPHOCYTE: 35 %
WBC: 5.1 10*3/uL (ref 3.8–10.8)
WBCMIX: 281 {cells}/uL (ref 200–950)

## 2017-12-09 LAB — COMPREHENSIVE METABOLIC PANEL
AG RATIO: 2.3 (calc) (ref 1.0–2.5)
ALT: 19 U/L (ref 6–29)
AST: 15 U/L (ref 10–35)
Albumin: 4.3 g/dL (ref 3.6–5.1)
Alkaline phosphatase (APISO): 66 U/L (ref 33–130)
BUN: 18 mg/dL (ref 7–25)
CHLORIDE: 106 mmol/L (ref 98–110)
CO2: 28 mmol/L (ref 20–32)
CREATININE: 0.72 mg/dL (ref 0.50–0.99)
Calcium: 9.2 mg/dL (ref 8.6–10.4)
GLOBULIN: 1.9 g/dL (ref 1.9–3.7)
GLUCOSE: 112 mg/dL — AB (ref 65–99)
Potassium: 4.7 mmol/L (ref 3.5–5.3)
SODIUM: 141 mmol/L (ref 135–146)
TOTAL PROTEIN: 6.2 g/dL (ref 6.1–8.1)
Total Bilirubin: 0.4 mg/dL (ref 0.2–1.2)

## 2017-12-09 LAB — LIPID PANEL
CHOL/HDL RATIO: 4.1 (calc) (ref <5.0)
Cholesterol: 162 mg/dL (ref <200)
HDL: 40 mg/dL — AB (ref >50)
LDL CHOLESTEROL (CALC): 108 mg/dL — AB
Non-HDL Cholesterol (Calc): 122 mg/dL (calc) (ref <130)
TRIGLYCERIDES: 53 mg/dL (ref <150)

## 2017-12-09 LAB — VITAMIN D 25 HYDROXY (VIT D DEFICIENCY, FRACTURES): VIT D 25 HYDROXY: 39 ng/mL (ref 30–100)

## 2017-12-09 LAB — TSH: TSH: 1.2 m[IU]/L (ref 0.40–4.50)

## 2018-03-01 ENCOUNTER — Ambulatory Visit (INDEPENDENT_AMBULATORY_CARE_PROVIDER_SITE_OTHER): Payer: Managed Care, Other (non HMO)

## 2018-03-01 ENCOUNTER — Other Ambulatory Visit: Payer: Managed Care, Other (non HMO)

## 2018-03-01 ENCOUNTER — Other Ambulatory Visit: Payer: Self-pay | Admitting: Gynecology

## 2018-03-01 DIAGNOSIS — Z13820 Encounter for screening for osteoporosis: Secondary | ICD-10-CM

## 2018-03-01 DIAGNOSIS — M8589 Other specified disorders of bone density and structure, multiple sites: Secondary | ICD-10-CM

## 2018-03-01 DIAGNOSIS — R7309 Other abnormal glucose: Secondary | ICD-10-CM

## 2018-03-01 DIAGNOSIS — M8580 Other specified disorders of bone density and structure, unspecified site: Secondary | ICD-10-CM

## 2018-03-02 LAB — HEMOGLOBIN A1C
EAG (MMOL/L): 5.8 (calc)
HEMOGLOBIN A1C: 5.3 %{Hb} (ref <5.7)
MEAN PLASMA GLUCOSE: 105 (calc)

## 2018-03-02 LAB — GLUCOSE, FASTING: Glucose, Plasma: 99 mg/dL (ref 65–99)

## 2018-03-08 ENCOUNTER — Encounter: Payer: Self-pay | Admitting: Gynecology

## 2018-03-11 NOTE — Telephone Encounter (Signed)
Patient called about lab results from 03/02/18, informed with dexa results as well.

## 2018-10-19 ENCOUNTER — Telehealth: Payer: Self-pay | Admitting: *Deleted

## 2018-10-19 NOTE — Telephone Encounter (Signed)
Patient called stating PCP would like to start patient on Prilosec due to cough off/on since Feb, should would like her to take 1 po daily x 4 week before breakfast. Patient does have Osteoporosis and asked if okay to take Prilosec with this diagnosis? She did admit she is not taking her vitamin d and calcium, states it makes her feel "sick".  Please advise

## 2018-10-19 NOTE — Telephone Encounter (Signed)
There is a potential issue with antacids and calcium absorption.  It is not an absolute.  I would not withhold the ant acid for that reason.  I would recommend though that she take calcium supplement daily such as Tums which she can chew and may be easier on her stomach than calcium pills.  I would take them separately from when she takes the Prilosec.

## 2018-10-19 NOTE — Telephone Encounter (Signed)
Patient informed with below note, transferred to appointment desk to schedule annual exam

## 2018-12-01 ENCOUNTER — Other Ambulatory Visit: Payer: Self-pay

## 2018-12-02 ENCOUNTER — Ambulatory Visit (INDEPENDENT_AMBULATORY_CARE_PROVIDER_SITE_OTHER): Payer: Managed Care, Other (non HMO) | Admitting: Gynecology

## 2018-12-02 ENCOUNTER — Encounter: Payer: Self-pay | Admitting: Gynecology

## 2018-12-02 VITALS — BP 136/80 | Ht 65.0 in | Wt 157.0 lb

## 2018-12-02 DIAGNOSIS — N3000 Acute cystitis without hematuria: Secondary | ICD-10-CM | POA: Diagnosis not present

## 2018-12-02 DIAGNOSIS — N952 Postmenopausal atrophic vaginitis: Secondary | ICD-10-CM

## 2018-12-02 DIAGNOSIS — Z13220 Encounter for screening for lipoid disorders: Secondary | ICD-10-CM

## 2018-12-02 DIAGNOSIS — Z01419 Encounter for gynecological examination (general) (routine) without abnormal findings: Secondary | ICD-10-CM | POA: Diagnosis not present

## 2018-12-02 DIAGNOSIS — R42 Dizziness and giddiness: Secondary | ICD-10-CM | POA: Diagnosis not present

## 2018-12-02 DIAGNOSIS — M810 Age-related osteoporosis without current pathological fracture: Secondary | ICD-10-CM | POA: Diagnosis not present

## 2018-12-02 MED ORDER — CIPROFLOXACIN HCL 250 MG PO TABS: 250.0000 mg | ORAL_TABLET | Freq: Two times a day (BID) | ORAL | 0 refills | Status: DC

## 2018-12-02 NOTE — Progress Notes (Signed)
    Sandra Morales 10/02/1955 [id number]        63 y.o.  [name] for annual gynecologic exam.  Several issues noted below  Past medical history,surgical history, problem list, medications, allergies, family history and social history were all reviewed and documented as reviewed in the EPIC chart.  ROS:  Performed with pertinent positives and negatives included in the history, assessment and plan.   Additional significant findings : None   Exam: [name] assistant Vitals:   12/02/18 1557  BP: 136/80  Weight: 157 lb (71.2 kg)  Height: 5\' 5"  (1.651 m)   Body mass index is 26.13 kg/m.  General appearance:  Normal affect, orientation and appearance. Skin: Grossly normal HEENT: Without gross lesions.  No cervical or supraclavicular adenopathy. Thyroid normal.  Lungs:  Clear without wheezing, rales or rhonchi Cardiac: RR, without RMG Abdominal:  Soft, nontender, without masses, guarding, rebound, organomegaly or hernia Breasts:  Examined lying and sitting without masses, retractions, discharge or axillary adenopathy. Pelvic:  Ext, BUS, Vagina: With atrophic changes  Cervix: With atrophic changes  Uterus: Anteverted, normal size, shape and contour, midline and mobile nontender   Adnexa: Without masses or tenderness    Anus and perineum: Normal   Rectovaginal: Normal sphincter tone without palpated masses or tenderness.    Assessment/Plan:  63 y.o. [name] female for annual gynecologic exam.   1. Postmenopausal.  No significant menopausal symptoms or any vaginal bleeding. 2. Dark urine/bladder twinges.  Patient notes her urine is dark over the last week or so and notes some twinges with urination.  No frank dysuria, urgency low back pain fever or chills.  Urine analysis consistent with UTI.  Will treat with ciprofloxacin 250 mg twice daily x3 days.  Follow-up if symptoms persist, worsen or recur. 3. Osteoporosis.  DEXA 2013 T score -2.8.  Transiently took Fosamax.  DEXA 2019 T  score -2.0 stable from prior DEXA.  Plan repeat DEXA next year at 2-year interval.  Check vitamin D and TSH today. 4. Mammography 07/2017.  Patient overdue and will schedule.  Breast exam normal today. 5. Pap smear/HPV.  No Pap smear done today.  History of high risk HPV 2015- subtype 16, 18/45 with normal cytology.  Follow-up Pap smears 2016 and 17 were normal with negative HPV.  Plan repeat Pap smear at 5-year interval per current screening guidelines. 6. Colonoscopy 2011.  Planned repeat next year at 10-year interval. 7. Health maintenance.  Requests baseline labs.  CBC, CMP, lipid profile, TSH and vitamin D ordered.  Is being evaluated for dizziness and I ordered a TSH also in follow-up of this along with her osteoporosis history.  Follow-up 1 year, sooner as needed.   [name], 4:10 PM 12/02/2018

## 2018-12-02 NOTE — Patient Instructions (Signed)
Schedule your mammogram.  Take the prescribed antibiotic twice daily for the UTI.  Follow-up if your urinary symptoms continue.

## 2018-12-02 NOTE — Addendum Note (Signed)
Addended by: Nelva Nay on: 12/02/2018 04:50 PM   Modules accepted: Orders

## 2018-12-04 LAB — URINALYSIS, COMPLETE W/RFL CULTURE
Bilirubin Urine: NEGATIVE
Glucose, UA: NEGATIVE
Hyaline Cast: NONE SEEN /LPF
Ketones, ur: NEGATIVE
Nitrites, Initial: NEGATIVE
Protein, ur: NEGATIVE
Specific Gravity, Urine: 1.025 (ref 1.001–1.03)
pH: 6 (ref 5.0–8.0)

## 2018-12-04 LAB — URINE CULTURE
MICRO NUMBER:: 658749
Result:: NO GROWTH
SPECIMEN QUALITY:: ADEQUATE

## 2018-12-04 LAB — CULTURE INDICATED

## 2018-12-06 ENCOUNTER — Other Ambulatory Visit: Payer: Self-pay

## 2018-12-06 ENCOUNTER — Other Ambulatory Visit: Payer: Managed Care, Other (non HMO)

## 2018-12-07 ENCOUNTER — Encounter: Payer: Self-pay | Admitting: Gynecology

## 2018-12-07 LAB — VITAMIN D 25 HYDROXY (VIT D DEFICIENCY, FRACTURES): Vit D, 25-Hydroxy: 33 ng/mL (ref 30–100)

## 2018-12-07 LAB — CBC WITH DIFFERENTIAL/PLATELET
Absolute Monocytes: 281 cells/uL (ref 200–950)
Basophils Absolute: 42 cells/uL (ref 0–200)
Basophils Relative: 0.8 %
Eosinophils Absolute: 99 cells/uL (ref 15–500)
Eosinophils Relative: 1.9 %
HCT: 38.4 % (ref 35.0–45.0)
Hemoglobin: 12.9 g/dL (ref 11.7–15.5)
Lymphs Abs: 1581 cells/uL (ref 850–3900)
MCH: 28.7 pg (ref 27.0–33.0)
MCHC: 33.6 g/dL (ref 32.0–36.0)
MCV: 85.5 fL (ref 80.0–100.0)
MPV: 10.7 fL (ref 7.5–12.5)
Monocytes Relative: 5.4 %
Neutro Abs: 3198 cells/uL (ref 1500–7800)
Neutrophils Relative %: 61.5 %
Platelets: 195 10*3/uL (ref 140–400)
RBC: 4.49 10*6/uL (ref 3.80–5.10)
RDW: 11.8 % (ref 11.0–15.0)
Total Lymphocyte: 30.4 %
WBC: 5.2 10*3/uL (ref 3.8–10.8)

## 2018-12-07 LAB — COMPREHENSIVE METABOLIC PANEL
AG Ratio: 1.8 (calc) (ref 1.0–2.5)
ALT: 30 U/L — ABNORMAL HIGH (ref 6–29)
AST: 21 U/L (ref 10–35)
Albumin: 4.2 g/dL (ref 3.6–5.1)
Alkaline phosphatase (APISO): 75 U/L (ref 37–153)
BUN: 18 mg/dL (ref 7–25)
CO2: 27 mmol/L (ref 20–32)
Calcium: 9.3 mg/dL (ref 8.6–10.4)
Chloride: 107 mmol/L (ref 98–110)
Creat: 0.65 mg/dL (ref 0.50–0.99)
Globulin: 2.3 g/dL (calc) (ref 1.9–3.7)
Glucose, Bld: 108 mg/dL — ABNORMAL HIGH (ref 65–99)
Potassium: 4.3 mmol/L (ref 3.5–5.3)
Sodium: 141 mmol/L (ref 135–146)
Total Bilirubin: 0.5 mg/dL (ref 0.2–1.2)
Total Protein: 6.5 g/dL (ref 6.1–8.1)

## 2018-12-07 LAB — LIPID PANEL
Cholesterol: 183 mg/dL (ref <200)
HDL: 58 mg/dL (ref >=50)
LDL Cholesterol (Calc): 110 mg/dL (calc) — ABNORMAL HIGH
Non-HDL Cholesterol (Calc): 125 mg/dL (calc) (ref <130)
Total CHOL/HDL Ratio: 3.2 (calc) (ref <5.0)
Triglycerides: 60 mg/dL (ref <150)

## 2018-12-07 LAB — TSH: TSH: 1.73 mIU/L (ref 0.40–4.50)

## 2018-12-14 NOTE — Telephone Encounter (Signed)
My chart message came back unread, patient informed.

## 2018-12-21 NOTE — Telephone Encounter (Signed)
My CHart message returned unread. I called patient and per DPR access note on file I read her the email message and told her that she can see if in her My Chart e-mail.

## 2019-01-13 ENCOUNTER — Encounter: Payer: Self-pay | Admitting: Gynecology

## 2019-01-20 ENCOUNTER — Encounter: Payer: Self-pay | Admitting: Gynecology

## 2019-02-21 ENCOUNTER — Encounter: Payer: Self-pay | Admitting: Gynecology

## 2019-04-25 ENCOUNTER — Telehealth: Payer: Self-pay | Admitting: *Deleted

## 2019-04-25 NOTE — Telephone Encounter (Signed)
Okay to take vitamin D with Pepcid.

## 2019-04-25 NOTE — Telephone Encounter (Signed)
Patient is now taking Pepcid recommended by Dr. Sabra Heck, she takes vitamin d asked if okay to take this along with pepcid? Please advise

## 2019-04-26 NOTE — Telephone Encounter (Signed)
Patient informed. 

## 2019-06-23 ENCOUNTER — Other Ambulatory Visit: Payer: Self-pay

## 2019-06-23 ENCOUNTER — Emergency Department (HOSPITAL_BASED_OUTPATIENT_CLINIC_OR_DEPARTMENT_OTHER)
Admission: EM | Admit: 2019-06-23 | Discharge: 2019-06-23 | Disposition: A | Discharge status: Home / Self Care | Payer: Managed Care, Other (non HMO) | Attending: Emergency Medicine | Admitting: Emergency Medicine

## 2019-06-23 ENCOUNTER — Encounter (HOSPITAL_BASED_OUTPATIENT_CLINIC_OR_DEPARTMENT_OTHER): Payer: Self-pay

## 2019-06-23 ENCOUNTER — Emergency Department (HOSPITAL_BASED_OUTPATIENT_CLINIC_OR_DEPARTMENT_OTHER): Payer: Managed Care, Other (non HMO)

## 2019-06-23 DIAGNOSIS — Z87891 Personal history of nicotine dependence: Secondary | ICD-10-CM | POA: Insufficient documentation

## 2019-06-23 DIAGNOSIS — R2242 Localized swelling, mass and lump, left lower limb: Secondary | ICD-10-CM | POA: Diagnosis present

## 2019-06-23 DIAGNOSIS — M79605 Pain in left leg: Secondary | ICD-10-CM

## 2019-06-23 NOTE — ED Notes (Signed)
Pt transported to US

## 2019-06-23 NOTE — Discharge Instructions (Addendum)
Your ultrasound did not show any signs of a blood clot today.   Please take Aleve and Tylenol as needed for pain at home. Rest, ice, and elevate your leg for comfort.   Follow up with your PCP regarding your ED visit today. If you pain persists you may need a repeat ultrasound in 2 weeks.   Return to the ED for any worsening symptoms including worsening pain, worsening swelling, inability to bare weight on your leg, redness to your leg, chest pain, or shortness of breath.

## 2019-06-23 NOTE — ED Triage Notes (Signed)
Pt c/o swelling to left LE x 1 week-sent from PCP to r/o DVT-pt is +covid tested 15 days ago-NAD-steady gait

## 2019-06-23 NOTE — ED Provider Notes (Signed)
[facility] EM[facility] DEPARTMENT Provider Note   CSN: [id number] Arrival date & time: 06/23/19  1254     History Chief Complaint  Patient presents with  . Leg Swelling    Sandra Morales is a 64 y.o. female who presents to the ED today complaining of gradual onset, constant, achy, 4/10, left leg pain and swelling x 1 week.  Patient reports that she tested positive for COVID-19 on 1/14.  She states that she quarantine for 14 days and while in quarantine began having pain to the back of her leg.  She followed up with her primary care doctor today who sent her here to rule out a DVT.  No history of DVT/PE.  Patient states that she has been a little bit more immobile than normal with her COVID-19 diagnosis however states that she has been actively trying to walk around the house and the Fitbit tells her that she needs to get up and move.  She states that she has been working from home and typically will stand at her standing desk however for the past week has had to sit down due to feeling fatigued as well as due to the pain.  Patient has been taking over-the-counter medications with mild relief.  No exogenous hormone use.  No active malignancy.  No hemoptysis.  States that she does not feel short of breath or has chest pain currently.   The history is provided by the patient.       Past Medical History:  Diagnosis Date  . Abnormal cervical Pap smear with positive HPV DNA test 10/2013   Normal cytology with positive high-risk HPV screen negative subtype 16/18/45. Repeat Pap smear/HPV 2016 negative  . GERD (gastroesophageal reflux disease)   . Lump in neck    left; MUSCLE TIGHTNESS  . Muscle spasm   . MVP (mitral valve prolapse)   . Osteoporosis 12/2015   2013 T score -2.8, 2015 T score -2.4 2017 T score -2.0 improve from prior DEXA  . SVD (spontaneous vaginal delivery)    x 1  . Vertigo     Patient Active Problem List   Diagnosis Date Noted  . Muscle spasm   . MVP (mitral  valve prolapse)     Past Surgical History:  Procedure Laterality Date  . COLONOSCOPY    . DILATATION & CURETTAGE/HYSTEROSCOPY WITH TRUECLEAR N/A 07/28/2013   Procedure: DILATATION & CURETTAGE/HYSTEROSCOPY WITH TRUCLEAR;  Surgeon: [name], MD;  Location: [facility];  Service: Gynecology;  Laterality: N/A;  . GANGLION CYST EXCISION    . NASAL SINUS SURGERY       OB History    Gravida  1   Para  1   Term  1   Preterm      AB      Living  1     SAB      TAB      Ectopic      Multiple      Live Births              Family History  Problem Relation Age of Onset  . Hyperlipidemia Mother   . Hypertension Father   . Hypertension Sister   . Breast cancer Maternal Grandmother 17  . Cancer Paternal Grandmother        Esophageal  . Heart disease Paternal Aunt     Social History   Tobacco Use  . Smoking status: Former Smoker    Types: Cigarettes  .  Smokeless tobacco: Never Used  . Tobacco comment: IN HIGH SCHOOL AND EARLY COLLEGE  Substance Use Topics  . Alcohol use: Yes    Alcohol/week: 0.0 standard drinks    Comment: Rare  . Drug use: No    Home Medications Prior to Admission medications   Medication Sig Start Date End Date Taking? Authorizing Provider  calcium carbonate (TUMS - DOSED IN MG ELEMENTAL CALCIUM) 500 MG chewable tablet Chew 1 tablet by mouth daily.    [provider]  fluticasone (FLONASE) 50 MCG/ACT nasal spray Place into both nostrils daily.    [provider]  metaxalone (SKELAXIN) 800 MG tablet Take 800 mg by mouth 3 (three) times daily. For muscle tightness    [provider]  metoprolol tartrate (LOPRESSOR) 25 MG tablet Take 25 mg by mouth 2 (two) times daily.    [provider]  Omeprazole (PRILOSEC PO) Take by mouth.    [provider]    Allergies    Codeine, Flagyl [metronidazole hcl], Penicillins, Prozac [fluoxetine hcl], Sulphadimidine sodium [sulfamethazine sodium], and Sulfa  antibiotics  Review of Systems   Review of Systems  Respiratory: Negative for shortness of breath.   Cardiovascular: Positive for leg swelling. Negative for chest pain.  Musculoskeletal: Positive for arthralgias.    Physical Exam Updated Vital Signs BP (!) 154/78 (BP Location: Left Arm)   Pulse 87   Temp 98.5 F (36.9 C) (Oral)   Resp 14   Ht 5\' 4"  (1.626 m)   Wt 70.8 kg   SpO2 99%   BMI 26.78 kg/m   Physical Exam Vitals and nursing note reviewed.  Constitutional:      Appearance: She is not ill-appearing.  HENT:     Head: Normocephalic and atraumatic.  Eyes:     Conjunctiva/sclera: Conjunctivae normal.  Cardiovascular:     Rate and Rhythm: Normal rate and regular rhythm.     Pulses: Normal pulses.  Pulmonary:     Effort: Pulmonary effort is normal.     Breath sounds: Normal breath sounds. No wheezing, rhonchi or rales.  Musculoskeletal:     Comments: Mild increase in swelling LLE compared to RLE. No erythema or increased warmth to the touch. + TTP to popliteal area as well as calf. ROM intact to hip, knee, and ankle. Compartment soft. 2+ DP pulse.   Skin:    General: Skin is warm and dry.     Coloration: Skin is not jaundiced.  Neurological:     Mental Status: She is alert.     ED Results / Procedures / Treatments   Labs (all labs ordered are listed, but only abnormal results are displayed) Labs Reviewed - No data to display  EKG None  Radiology US Venous Img Lower Unilateral Left  Result Date: 06/23/2019 CLINICAL DATA:  64 year old female positive COVID-19. Left leg swelling and pain. EXAM: LEFT LOWER EXTREMITY VENOUS DOPPLER ULTRASOUND TECHNIQUE: Gray-scale sonography with compression, as well as color and duplex ultrasound, were performed to evaluate the deep venous system(s) from the level of the common femoral vein through the popliteal and proximal calf veins. COMPARISON:  None. FINDINGS: VENOUS Normal compressibility of the common femoral, superficial  femoral, and popliteal veins, as well as the visualized calf veins. Visualized portions of profunda femoral vein and great saphenous vein unremarkable. No filling defects to suggest DVT on grayscale or color Doppler imaging. Doppler waveforms show normal direction of venous flow, normal respiratory phasicity and response to augmentation. Limited views of the contralateral common  femoral vein are unremarkable. OTHER None. Limitations: none IMPRESSION: No evidence of left lower extremity deep venous thrombosis. Electronically Signed   By: Genevie Ann M.D.   On: 06/23/2019 15:35    Procedures Procedures (including critical care time)  Medications Ordered in ED Medications - No data to display  ED Course  I have reviewed the triage vital signs and the nursing notes.  Pertinent labs & imaging results that were available during my care of the patient were reviewed by me and considered in my medical decision making (see chart for details).  64 year old female who presents to the ED from PCPs office for DVT study.  Has been having some left lower extremity pain and swelling for the past week, tested positive for COVID-19 on 1/14 and has been quarantining since then.  Symptoms have improved in regards to this.  Will obtain DVT study at this time.  Without any bony tenderness or recent trauma to warrant x-rays at this time.   DVT study negative.  Had discussed with patient that we could obtain a x-ray of her knee given pain to the popliteal area however she has no bony tenderness, range of motion normal, no pain with ambulation and no trauma.  Patient is deferring x-ray at this time and feel this is appropriate.  Advised patient that she should take Tylenol and Aleve at home for symptomatic relief, rice therapy discussed.  Patient advised she will need to follow-up with her PCP and if her symptoms persist she may need a repeat ultrasound.  Strict return precautions have been discussed; patient is in agreement with  plan and stable for discharge home.   This note was prepared using Dragon voice recognition software and may include unintentional dictation errors due to the inherent limitations of voice recognition software.     MDM Rules/Calculators/A&P                      Final Clinical Impression(s) / ED Diagnoses Final diagnoses:  Left leg pain    Rx / DC Orders ED Discharge Orders    None       Discharge Instructions     Your ultrasound did not show any signs of a blood clot today.   Please take Aleve and Tylenol as needed for pain at home. Rest, ice, and elevate your leg for comfort.   Follow up with your PCP regarding your ED visit today. If you pain persists you may need a repeat ultrasound in 2 weeks.   Return to the ED for any worsening symptoms including worsening pain, worsening swelling, inability to bare weight on your leg, redness to your leg, chest pain, or shortness of breath.        Eustaquio Maize, PA-C 06/23/19 [place], MD 06/24/19 [phone]

## 2019-08-01 ENCOUNTER — Encounter: Payer: Self-pay | Admitting: Gynecology

## 2019-08-02 NOTE — Progress Notes (Signed)
25-month follow-up right breast mammogram is recommended for this patient to follow calcifications.

## 2019-08-31 ENCOUNTER — Ambulatory Visit: Payer: Managed Care, Other (non HMO) | Attending: Internal Medicine

## 2019-08-31 DIAGNOSIS — Z23 Encounter for immunization: Secondary | ICD-10-CM

## 2019-08-31 NOTE — Progress Notes (Signed)
   Covid-19 Vaccination Clinic  Name:  Sandra Morales    MRN: [id number] DOB: 1955/11/04  08/31/2019  [name] was observed post Covid-19 immunization for 15 minutes without incident. She was provided with Vaccine Information Sheet and instruction to access the V-Safe system.   [name] was instructed to call 911 with any severe reactions post vaccine: Marland Kitchen Difficulty breathing  . Swelling of face and throat  . A fast heartbeat  . A bad rash all over body  . Dizziness and weakness   Immunizations Administered    Name Date Dose VIS Date Route   Pfizer COVID-19 Vaccine 08/31/2019  4:01 PM 0.3 mL 05/05/2019 Intramuscular   Manufacturer: [place]   Lot: [id number]   [place]: [id number]

## 2019-09-22 ENCOUNTER — Ambulatory Visit: Payer: Managed Care, Other (non HMO) | Attending: Internal Medicine

## 2019-09-22 DIAGNOSIS — Z23 Encounter for immunization: Secondary | ICD-10-CM

## 2019-09-22 NOTE — Progress Notes (Signed)
   Covid-19 Vaccination Clinic  Name:  Sandra Morales    MRN: [id number] DOB: 1956/01/12  09/22/2019  [name] was observed post Covid-19 immunization for 30 minutes based on pre-vaccination screening without incident. She was provided with Vaccine Information Sheet and instruction to access the V-Safe system.   [name] was instructed to call 911 with any severe reactions post vaccine: Marland Kitchen Difficulty breathing  . Swelling of face and throat  . A fast heartbeat  . A bad rash all over body  . Dizziness and weakness   Immunizations Administered    Name Date Dose VIS Date Route   Pfizer COVID-19 Vaccine 09/22/2019  5:03 PM 0.3 mL 07/19/2018 Intramuscular   Manufacturer: [place]   Lot: [id number]   [place]: [id number]

## 2019-09-26 ENCOUNTER — Ambulatory Visit: Payer: Managed Care, Other (non HMO)

## 2019-10-24 ENCOUNTER — Other Ambulatory Visit: Payer: Self-pay | Admitting: Family Medicine

## 2019-10-24 DIAGNOSIS — E785 Hyperlipidemia, unspecified: Secondary | ICD-10-CM

## 2019-11-10 ENCOUNTER — Ambulatory Visit
Admission: RE | Admit: 2019-11-10 | Discharge: 2019-11-10 | Disposition: A | Discharge status: Home / Self Care | Payer: No Typology Code available for payment source | Source: Ambulatory Visit | Attending: Family Medicine | Admitting: Family Medicine

## 2019-11-10 DIAGNOSIS — E785 Hyperlipidemia, unspecified: Secondary | ICD-10-CM

## 2020-01-31 ENCOUNTER — Ambulatory Visit (INDEPENDENT_AMBULATORY_CARE_PROVIDER_SITE_OTHER): Payer: Managed Care, Other (non HMO) | Admitting: Obstetrics & Gynecology

## 2020-01-31 ENCOUNTER — Other Ambulatory Visit: Payer: Self-pay

## 2020-01-31 ENCOUNTER — Encounter: Payer: Self-pay | Admitting: Obstetrics & Gynecology

## 2020-01-31 VITALS — BP 136/88 | Ht 64.0 in | Wt 160.4 lb

## 2020-01-31 DIAGNOSIS — M8589 Other specified disorders of bone density and structure, multiple sites: Secondary | ICD-10-CM

## 2020-01-31 DIAGNOSIS — Z01419 Encounter for gynecological examination (general) (routine) without abnormal findings: Secondary | ICD-10-CM

## 2020-01-31 DIAGNOSIS — Z780 Asymptomatic menopausal state: Secondary | ICD-10-CM

## 2020-01-31 NOTE — Progress Notes (Signed)
Sandra Morales [birth year] [id number]   History:    64 y.o. G1P1L1 Married  RP:  Established patient for Annual Gynecologic Exam  HPI: Postmenopausal, well on no hormone replacement therapy.  No postmenopausal bleeding.  No pelvic pain.  No pain with intercourse.  Urine and bowel movements normal.  Breasts normal.  Body mass index 27.53.  Colonoscopy 2011.  Fasting health labs.  Past medical history,surgical history, family history and social history were all reviewed and documented in the EPIC chart.  Gynecologic History No LMP recorded. Patient is postmenopausal.  Obstetric History OB History  Gravida Para Term Preterm AB Living  _0 SAB TAB Ectopic Multiple Live Births               # Outcome Date GA Lbr Len/2nd Weight Sex Delivery Anes PTL Lv  1 Term              ROS: A ROS was performed and pertinent positives and negatives are included in the history.  GENERAL: No fevers or chills. HEENT: No change in vision, no earache, sore throat or sinus congestion. NECK: No pain or stiffness. CARDIOVASCULAR: No chest pain or pressure. No palpitations. PULMONARY: No shortness of breath, cough or wheeze. GASTROINTESTINAL: No abdominal pain, nausea, vomiting or diarrhea, melena or bright red blood per rectum. GENITOURINARY: No urinary frequency, urgency, hesitancy or dysuria. MUSCULOSKELETAL: No joint or muscle pain, no back pain, no recent trauma. DERMATOLOGIC: No rash, no itching, no lesions. ENDOCRINE: No polyuria, polydipsia, no heat or cold intolerance. No recent change in weight. HEMATOLOGICAL: No anemia or easy bruising or bleeding. NEUROLOGIC: No headache, seizures, numbness, tingling or weakness. PSYCHIATRIC: No depression, no loss of interest in normal activity or change in sleep pattern.     Exam:   BP 136/88    Ht _1  (1.626 m)    Wt 160 lb 6.4 oz (72.8 kg)    BMI 27.53 kg/m   Body mass index is 27.53 kg/m.  General appearance : Well developed well nourished  female. No acute distress HEENT: Eyes: no retinal hemorrhage or exudates,  Neck supple, trachea midline, no carotid bruits, no thyroidmegaly Lungs: Clear to auscultation, no rhonchi or wheezes, or rib retractions  Heart: Regular rate and rhythm, no murmurs or gallops Breast:Examined in sitting and supine position were symmetrical in appearance, no palpable masses or tenderness,  no skin retraction, no nipple inversion, no nipple discharge, no skin discoloration, no axillary or supraclavicular lymphadenopathy Abdomen: no palpable masses or tenderness, no rebound or guarding Extremities: no edema or skin discoloration or tenderness  Pelvic: Vulva: Normal             Vagina: No gross lesions or discharge  Cervix: No gross lesions or discharge.  Pap reflex done.  Uterus  AV, normal size, shape and consistency, non-tender and mobile  Adnexa  Without masses or tenderness  Anus: Normal   Assessment/Plan:  64 y.o. female for annual exam   1. Encounter for routine gynecological examination with Papanicolaou smear of cervix Normal gynecologic exam in menopause.  Pap reflex done today.  History of high-risk HPV in 2015.  Breast exam normal.  47-monthDx mammogram scheduled for September 2021. F/U Fasting Health Labs.  Colonoscopy to schedule this year.  BMI 27.53.  Fitness and healthy nutrition to continue. - CBC; Future - Comp Met (CMET); Future - Lipid panel; Future - TSH; Future - VITAMIN D 25 Hydroxy (Vit-D Deficiency,  Fractures); Future  2. Postmenopause Well on no HRT.  No PMB.  3. Osteopenia of multiple sites Vit D supplements, Ca++ 1200-1500 mg daily, weightbearing physical activities.  Schedule BD now. - DG Bone Density; Future  Other orders - losartan (COZAAR) 25 MG tablet; Take 25 mg by mouth daily.  Princess Bruins MD, 4:33 PM 01/31/2020

## 2020-02-01 LAB — URINALYSIS, COMPLETE W/RFL CULTURE
Bacteria, UA: NONE SEEN /HPF
Bilirubin Urine: NEGATIVE
Glucose, UA: NEGATIVE
Hyaline Cast: NONE SEEN /LPF
Ketones, ur: NEGATIVE
Leukocyte Esterase: NEGATIVE
Nitrites, Initial: NEGATIVE
Protein, ur: NEGATIVE
RBC / HPF: NONE SEEN /HPF (ref 0–2)
Specific Gravity, Urine: 1.02 (ref 1.001–1.03)
WBC, UA: NONE SEEN /HPF (ref 0–5)
pH: 6 (ref 5.0–8.0)

## 2020-02-01 LAB — NO CULTURE INDICATED

## 2020-02-01 NOTE — Addendum Note (Signed)
Addended by: Thurnell Garbe A on: 02/01/2020 09:09 AM   Modules accepted: Orders

## 2020-02-05 LAB — PAP IG W/ RFLX HPV ASCU

## 2020-02-21 ENCOUNTER — Encounter: Payer: Self-pay | Admitting: *Deleted

## 2020-02-22 ENCOUNTER — Encounter: Payer: Self-pay | Admitting: Neurology

## 2020-02-22 ENCOUNTER — Other Ambulatory Visit: Payer: Self-pay

## 2020-02-22 ENCOUNTER — Ambulatory Visit (INDEPENDENT_AMBULATORY_CARE_PROVIDER_SITE_OTHER): Payer: Managed Care, Other (non HMO) | Admitting: Neurology

## 2020-02-22 VITALS — BP 142/83 | HR 85 | Ht 64.0 in | Wt 163.0 lb

## 2020-02-22 DIAGNOSIS — R251 Tremor, unspecified: Secondary | ICD-10-CM | POA: Insufficient documentation

## 2020-02-22 NOTE — Progress Notes (Signed)
Chief Complaint  Patient presents with  . New Patient (Initial Visit)    Reports intermittent left arm tremor that varies in severity. She will also feel the tremor on the left side of her chest. Symptoms present for at least six months. States she occasionally has vertigo spells (once every couple of months). Meclizine is helpful when this occurs.   Marland Kitchen PCP    Kathyrn Lass, MD    HISTORICAL  Sandra Morales is a 64 year old female, seen in request by primary care physician Dr. Kathyrn Lass, for evaluation of tremor on the left hand, initial evaluation was on February 22, 2020.  I reviewed and summarized the referring note.  Past medical history Hypertension  Since March 2020, she noticed intermittent left hand, left upper extremity muscle tremor, small amplitude, intermittent, noticeable when she was trying to holding up her arm, typing, there was no significant limitation in her daily activity, only rarely happen at the right side,   She complains of mild tilting sensation when walk.  2 of her siblings have similar tremor, none of her parents have tremor.  She denied loss sense of smell, has a long history of nightmares, yelling out of dreams, no significant worsening   Laboratory evaluations in 2020, normal TSH, vitamin D 33, fasting lipid profile LDL 110, normal CMP, CBC, hemoglobin of 12.9  REVIEW OF SYSTEMS: Full 14 system review of systems performed and notable only for as above All other review of systems were negative.  ALLERGIES: Allergies  Allergen Reactions  . Codeine Rash    Rapid heart beat   . Flagyl [Metronidazole Hcl]     Cant remember  . Penicillins     Cant remember  . Prozac [Fluoxetine Hcl]     rapid heart beat  . Sulphadimidine Sodium [Sulfamethazine Sodium] Hives  . Nabumetone Swelling  . Omeprazole Other (See Comments)    Jittery  . Pepcid [Famotidine] Other (See Comments)    Feels bad, increased heart rate  . Protonix [Pantoprazole] Other (See  Comments)    Jittery   . Sulfa Antibiotics Hives and Swelling    HOME MEDICATIONS: Current Outpatient Medications  Medication Sig Dispense Refill  . Calcium Carb-Cholecalciferol (CALCIUM + VITAMIN D3 PO) Take 1 tablet by mouth daily.    Marland Kitchen losartan (COZAAR) 25 MG tablet Take 25 mg by mouth daily.    . meclizine (ANTIVERT) 25 MG tablet Take 25 mg by mouth 3 (three) times daily as needed for dizziness.    . metaxalone (SKELAXIN) 800 MG tablet Take 800 mg by mouth 3 (three) times daily as needed. For muscle tightness     . metoprolol tartrate (LOPRESSOR) 25 MG tablet Take 25 mg by mouth daily.     . naproxen sodium (ALEVE) 220 MG tablet Take 220 mg by mouth as needed.    . traMADol (ULTRAM) 50 MG tablet Take 50 mg by mouth daily as needed.     No current facility-administered medications for this visit.    PAST MEDICAL HISTORY: Past Medical History:  Diagnosis Date  . Abnormal cervical Pap smear with positive HPV DNA test 10/2013   Normal cytology with positive high-risk HPV screen negative subtype 16/18/45. Repeat Pap smear/HPV 2016 negative  . Anxiety   . Balance problem   . GERD (gastroesophageal reflux disease)   . Hypertension   . Low back pain   . Lump in neck    left; MUSCLE TIGHTNESS  . Muscle spasm   . MVP (mitral valve  prolapse)   . Osteoporosis 12/2015   2013 T score -2.8, 2015 T score -2.4 2017 T score -2.0 improve from prior DEXA  . Palpitations   . SVD (spontaneous vaginal delivery)    x 1  . Tremor   . Vertigo     PAST SURGICAL HISTORY: Past Surgical History:  Procedure Laterality Date  . COLONOSCOPY    . DILATATION & CURETTAGE/HYSTEROSCOPY WITH TRUECLEAR N/A 07/28/2013   Procedure: DILATATION & CURETTAGE/HYSTEROSCOPY WITH TRUCLEAR;  Surgeon: [name], MD;  Location: [facility];  Service: Gynecology;  Laterality: N/A;  . GANGLION CYST EXCISION    . NASAL SINUS SURGERY      FAMILY HISTORY: Family History  Problem Relation Age of Onset  .  Hyperlipidemia Mother   . Hypertension Father   . Hypertension Sister   . Breast cancer Maternal Grandmother 43  . Cancer Maternal Grandmother        OVARIAN , BREAST  . Cancer Paternal Grandmother        Esophageal  . Heart disease Paternal Aunt   . Parkinson's disease Maternal Aunt        She is unsure if this is an accurate diagnosis.    SOCIAL HISTORY: Social History   Socioeconomic History  . Marital status: Married    Spouse name: Not on file  . Number of children: 1  . Years of education: college  . Highest education level: Not on file  Occupational History  . Occupation: Glass blower/designer  Tobacco Use  . Smoking status: Former Smoker    Types: Cigarettes  . Smokeless tobacco: Never Used  . Tobacco comment: IN HIGH SCHOOL AND EARLY COLLEGE  Vaping Use  . Vaping Use: Never used  Substance and Sexual Activity  . Alcohol use: Yes    Alcohol/week: 0.0 standard drinks    Comment: Rare  . Drug use: No  . Sexual activity: Yes    Birth control/protection: Post-menopausal    Comment: 1st intercourse 34 yo-1 partner  Other Topics Concern  . Not on file  Social History Narrative   Lives at home with husband.   Right-handed.   One cup coffee per day.   Social Determinants of Health   Financial Resource Strain:   . Difficulty of Paying Living Expenses: Not on file  Food Insecurity:   . Worried About Charity fundraiser in the Last Year: Not on file  . Ran Out of Food in the Last Year: Not on file  Transportation Needs:   . Lack of Transportation (Medical): Not on file  . Lack of Transportation (Non-Medical): Not on file  Physical Activity:   . Days of Exercise per Week: Not on file  . Minutes of Exercise per Session: Not on file  Stress:   . Feeling of Stress : Not on file  Social Connections:   . Frequency of Communication with Friends and Family: Not on file  . Frequency of Social Gatherings with Friends and Family: Not on file  . Attends Religious Services:  Not on file  . Active Member of Clubs or Organizations: Not on file  . Attends Archivist Meetings: Not on file  . Marital Status: Not on file  Intimate Partner Violence:   . Fear of Current or Ex-Partner: Not on file  . Emotionally Abused: Not on file  . Physically Abused: Not on file  . Sexually Abused: Not on file     PHYSICAL EXAM   Vitals:   02/22/20 1043  BP: (!) 142/83  Pulse: 85  Weight: 163 lb (73.9 kg)  Height: 5\' 4"  (1.626 m)   Not recorded     Body mass index is 27.98 kg/m.  PHYSICAL EXAMNIATION:  Gen: NAD, conversant, well nourised, well groomed                     Cardiovascular: Regular rate rhythm, no peripheral edema, warm, nontender. Eyes: Conjunctivae clear without exudates or hemorrhage Neck: Supple, no carotid bruits. Pulmonary: Clear to auscultation bilaterally   NEUROLOGICAL EXAM:  MENTAL STATUS: Speech:    Speech is normal; fluent and spontaneous with normal comprehension.  Cognition:     Orientation to time, place and person     Normal recent and remote memory     Normal Attention span and concentration     Normal Language, naming, repeating,spontaneous speech     Fund of knowledge   CRANIAL NERVES: CN II: Visual fields are full to confrontation. Pupils are round equal and briskly reactive to light. CN III, IV, VI: extraocular movement are normal. No ptosis. CN V: Facial sensation is intact to light touch CN VII: Face is symmetric with normal eye closure  CN VIII: Hearing is normal to causal conversation. CN IX, X: Phonation is normal. CN XI: Head turning and shoulder shrug are intact  MOTOR: Normal muscle tone, strength, no significant resting tremor, no rigidity or bradykinesia noted  REFLEXES: Reflexes are 2+ and symmetric at the biceps, triceps, knees, and ankles. Plantar responses are flexor.  SENSORY: Intact to light touch, pinprick and vibratory sensation are intact in fingers and toes.  COORDINATION: There  is no trunk or limb dysmetria noted.  GAIT/STANCE: Posture is normal. Gait is steady with normal steps, base, arm swing, and turning. Heel and toe walking are normal. Tandem gait is normal.  Romberg is absent.   DIAGNOSTIC DATA (LABS, IMAGING, TESTING) - I reviewed patient records, labs, notes, testing and imaging myself where available.   ASSESSMENT AND PLAN  Makinlee D Grunwald is a 64 y.o. female   Intermittent hands tremor  She has no significant parkinsonian features  2 of her siblings have similar tremors, but none of her parents have similar tremors,  Differentiation diagnosis include essential tremor, exaggerated physiological tremor,  Continue follow-up with her primary care physician,  Return to clinic for worsening symptoms,   Marcial Pacas, M.D. Ph.D.  [facility] [address] [place], [place] [address] Ph: [phone] Fax: [phone]  CC:  Kathyrn Lass, [place],  [place] [address]

## 2020-03-07 ENCOUNTER — Encounter: Payer: Self-pay | Admitting: Obstetrics & Gynecology

## 2020-04-16 ENCOUNTER — Other Ambulatory Visit: Payer: Self-pay | Admitting: Obstetrics & Gynecology

## 2020-04-16 ENCOUNTER — Ambulatory Visit (INDEPENDENT_AMBULATORY_CARE_PROVIDER_SITE_OTHER): Payer: Managed Care, Other (non HMO)

## 2020-04-16 ENCOUNTER — Other Ambulatory Visit: Payer: Self-pay

## 2020-04-16 ENCOUNTER — Other Ambulatory Visit: Payer: Managed Care, Other (non HMO)

## 2020-04-16 DIAGNOSIS — Z780 Asymptomatic menopausal state: Secondary | ICD-10-CM

## 2020-04-16 DIAGNOSIS — M810 Age-related osteoporosis without current pathological fracture: Secondary | ICD-10-CM

## 2020-04-16 DIAGNOSIS — Z01419 Encounter for gynecological examination (general) (routine) without abnormal findings: Secondary | ICD-10-CM

## 2020-04-16 DIAGNOSIS — M8589 Other specified disorders of bone density and structure, multiple sites: Secondary | ICD-10-CM

## 2020-04-16 LAB — CBC
HCT: 37.6 % (ref 35.0–45.0)
Hemoglobin: 12.8 g/dL (ref 11.7–15.5)
MCH: 29.2 pg (ref 27.0–33.0)
MCHC: 34 g/dL (ref 32.0–36.0)
MCV: 85.6 fL (ref 80.0–100.0)
MPV: 10.7 fL (ref 7.5–12.5)
Platelets: 204 10*3/uL (ref 140–400)
RBC: 4.39 10*6/uL (ref 3.80–5.10)
RDW: 11.6 % (ref 11.0–15.0)
WBC: 5.2 10*3/uL (ref 3.8–10.8)

## 2020-04-16 LAB — COMPREHENSIVE METABOLIC PANEL
AG Ratio: 1.9 (calc) (ref 1.0–2.5)
ALT: 33 U/L — ABNORMAL HIGH (ref 6–29)
AST: 19 U/L (ref 10–35)
Albumin: 4.1 g/dL (ref 3.6–5.1)
Alkaline phosphatase (APISO): 70 U/L (ref 37–153)
BUN: 15 mg/dL (ref 7–25)
CO2: 31 mmol/L (ref 20–32)
Calcium: 9.4 mg/dL (ref 8.6–10.4)
Chloride: 104 mmol/L (ref 98–110)
Creat: 0.68 mg/dL (ref 0.50–0.99)
Globulin: 2.2 g/dL (calc) (ref 1.9–3.7)
Glucose, Bld: 112 mg/dL — ABNORMAL HIGH (ref 65–99)
Potassium: 3.9 mmol/L (ref 3.5–5.3)
Sodium: 142 mmol/L (ref 135–146)
Total Bilirubin: 0.4 mg/dL (ref 0.2–1.2)
Total Protein: 6.3 g/dL (ref 6.1–8.1)

## 2020-04-16 LAB — LIPID PANEL
Cholesterol: 189 mg/dL (ref <200)
HDL: 59 mg/dL (ref >=50)
LDL Cholesterol (Calc): 114 mg/dL (calc) — ABNORMAL HIGH
Non-HDL Cholesterol (Calc): 130 mg/dL (calc) — ABNORMAL HIGH (ref <130)
Total CHOL/HDL Ratio: 3.2 (calc) (ref <5.0)
Triglycerides: 67 mg/dL (ref <150)

## 2020-04-16 LAB — VITAMIN D 25 HYDROXY (VIT D DEFICIENCY, FRACTURES): Vit D, 25-Hydroxy: 27 ng/mL — ABNORMAL LOW (ref 30–100)

## 2020-04-16 LAB — TSH: TSH: 1.55 mIU/L (ref 0.40–4.50)

## 2020-09-01 IMAGING — CT CT CARDIAC CORONARY ARTERY CALCIUM SCORE
3 series · 13 of 20 positions shown, 15 images · non-contrast
Comparison: None.

CLINICAL DATA: 63-year-old white female with hyperlipidemia.

EXAM:
CT CARDIAC CORONARY ARTERY CALCIUM SCORE
TECHNIQUE: Non-contrast imaging through the heart was performed using
prospective ECG gating. Image post processing was performed on an
independent workstation, allowing for quantitative analysis of the
heart and coronary arteries. Note that this exam targets the heart
and the chest was not imaged in its entirety.

[Series 2: calcium scoring 2.00 qr36 bestdiast 69% hrt calciu · axial · 0.43mm/px · z∈[+1499,+1555]mm · 3 of 70 slices shown]
[im 14/70  vessel]
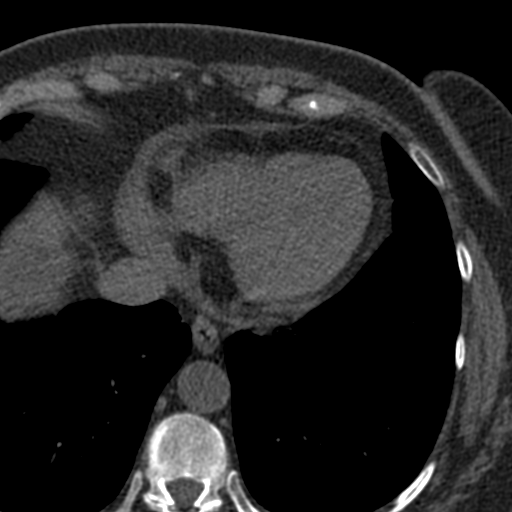
[im 28/70  vessel]
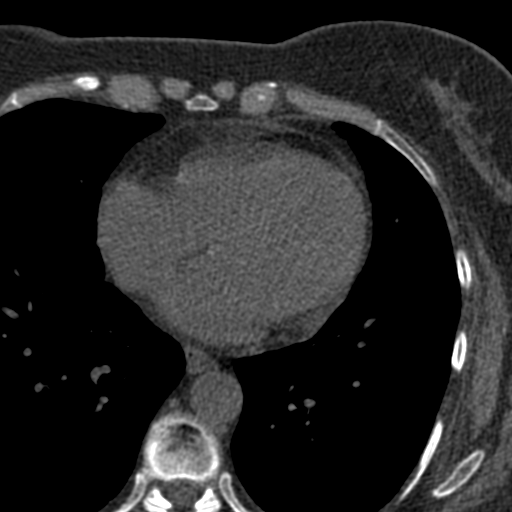
[im 42/70  vessel]
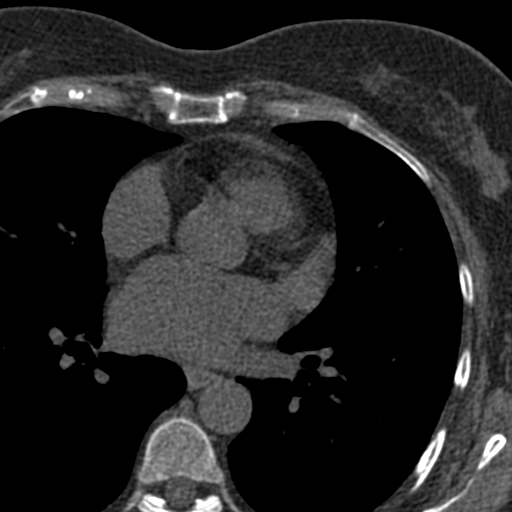

[Series 3: calcium scoring 2.00 br40 bestdiast 69% axial · axial · 0.57mm/px · z∈[+1495,+1587]mm · 5 of 70 slices shown, 7 images]
[im 12/70  vessel]
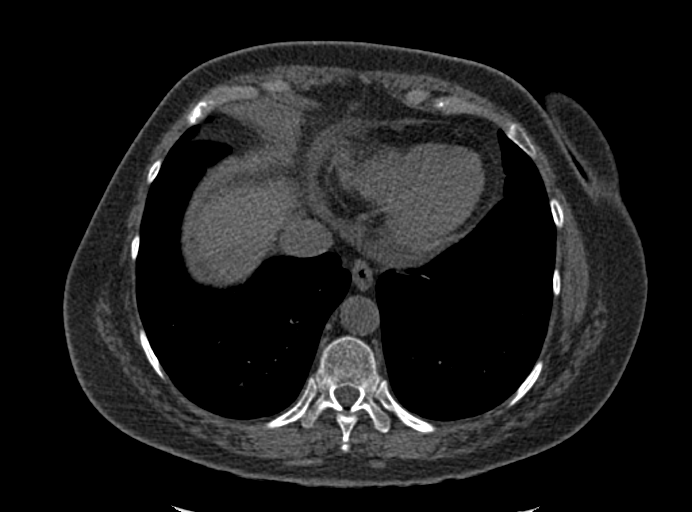
[im 12/70  lung]
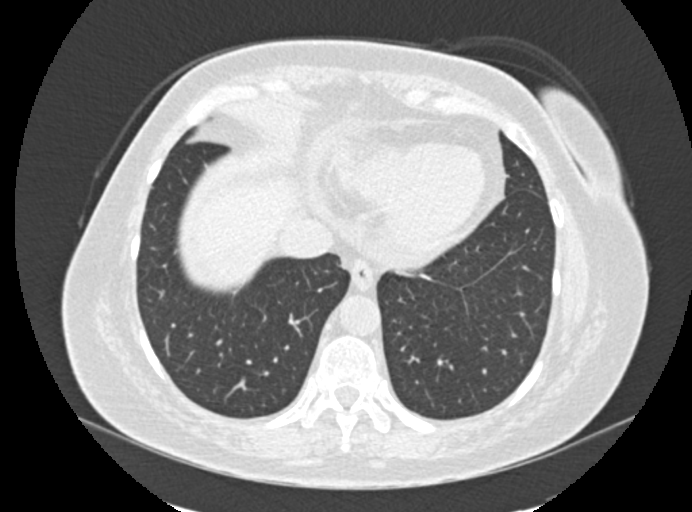
[im 24/70  vessel]
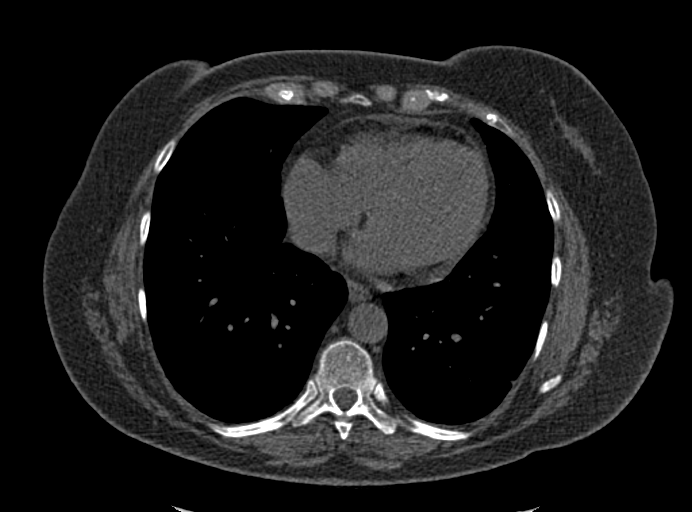
[im 35/70  vessel]
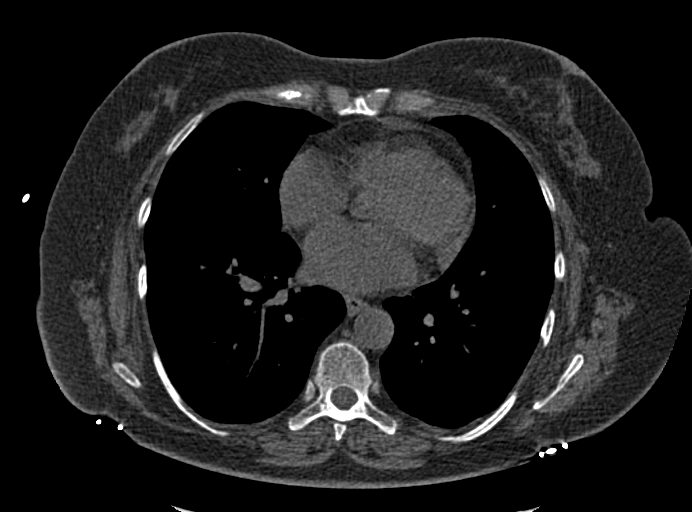
[im 47/70  vessel]
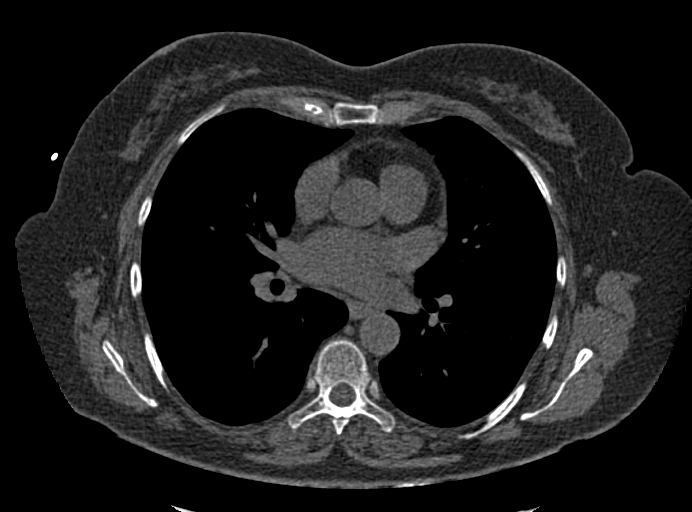
[im 58/70  vessel]
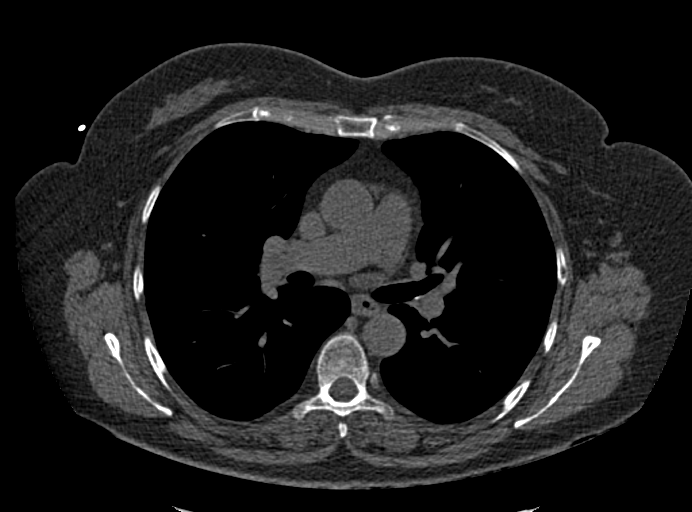
[im 58/70  lung]
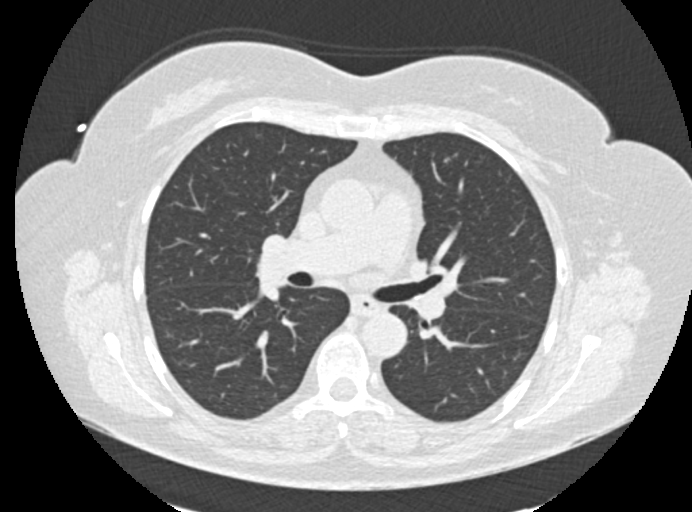

[Series 9: calcium scoring 2.00 br60 bestdiast 69% lungs · axial · 0.57mm/px · z∈[+1495,+1587]mm · 5 of 70 slices shown]
[im 12/70  vessel]
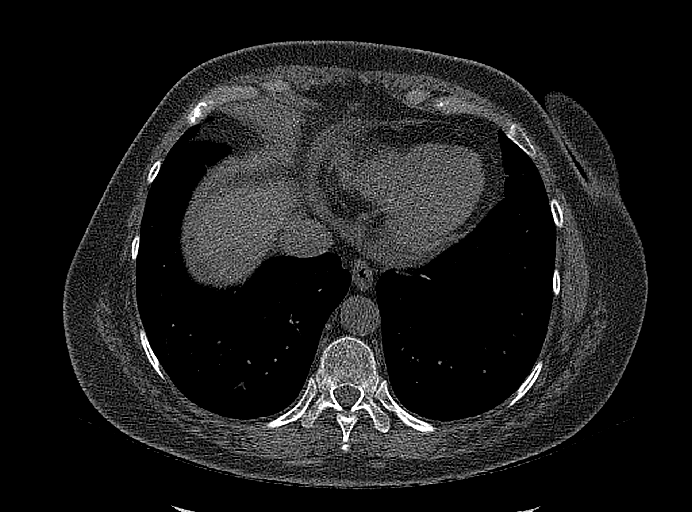
[im 24/70  vessel]
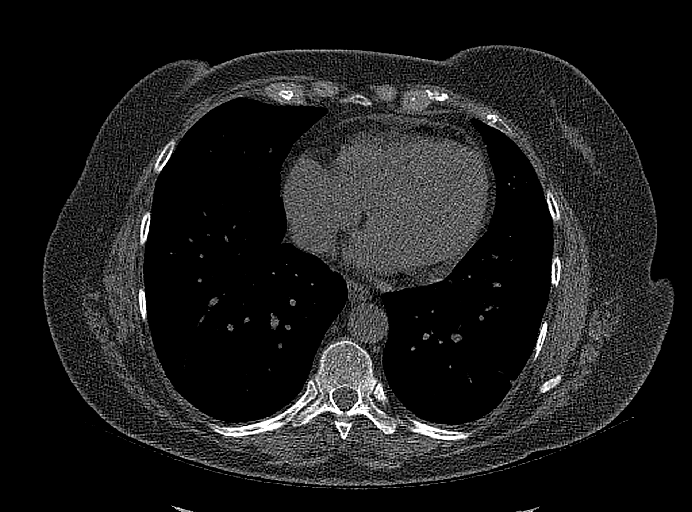
[im 35/70  vessel]
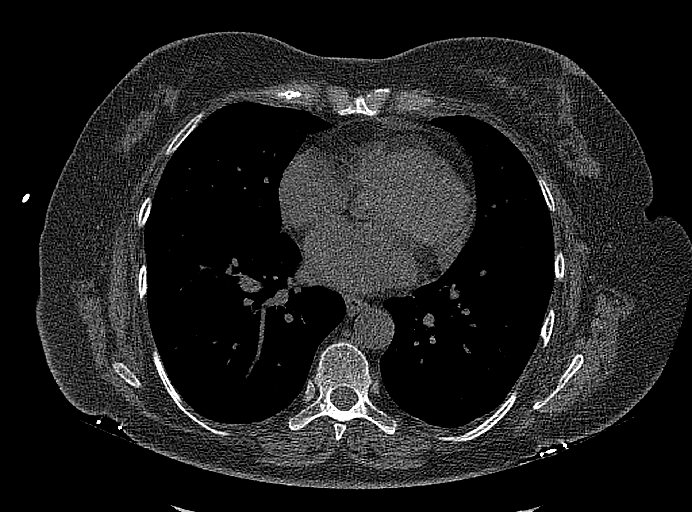
[im 47/70  vessel]
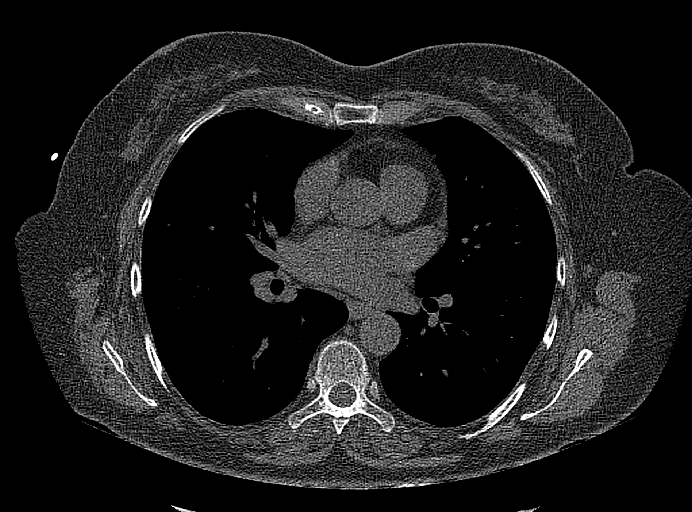
[im 58/70  vessel]
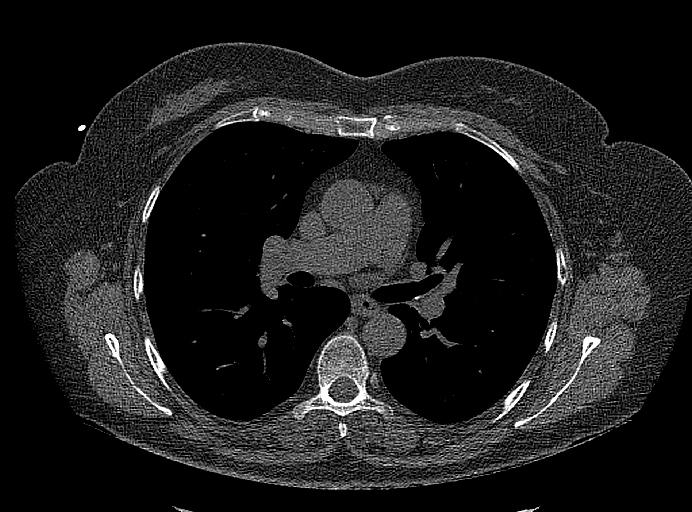

[13 of 20 positions shown; findings below may reference images not displayed]

FINDINGS: CORONARY CALCIUM SCORES:

Left Main: 0

LAD: 0

LCx: 0

RCA: 0

Total Agatston Score: 0

[HOSPITAL] percentile: 0

AORTA MEASUREMENTS:

Ascending Aorta: 30 mm

Descending Aorta: 25 mm

OTHER FINDINGS:

Heart size is normal. Small amount of pericardial fluid. Visualized
mediastinal structures are unremarkable. Images of upper abdomen are
unremarkable. Pleural-based nodule in the right middle lobe on
sequence 9, image 38 measures 4 mm. 3 mm nodule in the left upper
lobe on sequence 9, image 9. Additional tiny pulmonary nodules. No
significant airspace disease or consolidation in the visualized
lungs. No large pleural effusions. Bone structures are unremarkable.
IMPRESSION: 1. No evidence for coronary artery calcium. Coronary calcium score
is 0.
2. Several small pulmonary nodules, largest measuring 4 mm. These
small pulmonary nodules are nonspecific. No follow-up needed if
patient is low-risk (and has no known or suspected primary
neoplasm). Non-contrast chest CT can be considered in 12 months if
patient is high-risk. This recommendation follows the consensus
statement: Guidelines for Management of Incidental Pulmonary Nodules
Detected on CT Images: From the [HOSPITAL] 5056; Radiology

## 2020-10-22 ENCOUNTER — Telehealth: Payer: Self-pay

## 2020-10-22 NOTE — Telephone Encounter (Signed)
Opened in error. Already had encounter open. 

## 2020-10-22 NOTE — Telephone Encounter (Signed)
Patient said she just saw that her Vitamin D level back at 03/2020 AEX was low at 27.    Your recommendations on her lab results "Many lab abnormalities, manage with fam MD Dr Sabra Heck."  Patient called today asking for you to send in a prescription. Stated she cannot take the otc Vitamin D3 "because it makes me feel rotten".

## 2020-10-23 ENCOUNTER — Other Ambulatory Visit: Payer: Self-pay

## 2020-10-23 DIAGNOSIS — E559 Vitamin D deficiency, unspecified: Secondary | ICD-10-CM

## 2020-10-23 MED ORDER — VITAMIN D (ERGOCALCIFEROL) 1.25 MG (50000 UNIT) PO CAPS: 50000.0000 [IU] | ORAL_CAPSULE | ORAL | 0 refills | Status: AC

## 2020-10-23 NOTE — Telephone Encounter (Signed)
Patient informed. Rx sent. Lab order placed.

## 2021-04-25 ENCOUNTER — Ambulatory Visit: Payer: Managed Care, Other (non HMO) | Admitting: Obstetrics & Gynecology

## 2022-07-21 ENCOUNTER — Other Ambulatory Visit (HOSPITAL_BASED_OUTPATIENT_CLINIC_OR_DEPARTMENT_OTHER): Payer: Self-pay | Admitting: Family Medicine

## 2022-07-21 DIAGNOSIS — E7800 Pure hypercholesterolemia, unspecified: Secondary | ICD-10-CM

## 2022-08-20 ENCOUNTER — Ambulatory Visit (HOSPITAL_BASED_OUTPATIENT_CLINIC_OR_DEPARTMENT_OTHER)
Admission: RE | Admit: 2022-08-20 | Discharge: 2022-08-20 | Disposition: A | Discharge status: Home / Self Care | Payer: Managed Care, Other (non HMO) | Source: Ambulatory Visit | Attending: Family Medicine | Admitting: Family Medicine

## 2022-08-20 DIAGNOSIS — E7800 Pure hypercholesterolemia, unspecified: Secondary | ICD-10-CM | POA: Insufficient documentation

## 2023-06-28 ENCOUNTER — Emergency Department (HOSPITAL_BASED_OUTPATIENT_CLINIC_OR_DEPARTMENT_OTHER): Payer: Managed Care, Other (non HMO) | Admitting: Radiology

## 2023-06-28 ENCOUNTER — Encounter (HOSPITAL_BASED_OUTPATIENT_CLINIC_OR_DEPARTMENT_OTHER): Payer: Self-pay | Admitting: Emergency Medicine

## 2023-06-28 ENCOUNTER — Emergency Department (HOSPITAL_BASED_OUTPATIENT_CLINIC_OR_DEPARTMENT_OTHER)
Admission: EM | Admit: 2023-06-28 | Discharge: 2023-06-28 | Disposition: A | Discharge status: Home / Self Care | Payer: Managed Care, Other (non HMO)

## 2023-06-28 DIAGNOSIS — R0789 Other chest pain: Secondary | ICD-10-CM | POA: Insufficient documentation

## 2023-06-28 DIAGNOSIS — Z79899 Other long term (current) drug therapy: Secondary | ICD-10-CM | POA: Insufficient documentation

## 2023-06-28 DIAGNOSIS — R079 Chest pain, unspecified: Secondary | ICD-10-CM | POA: Diagnosis present

## 2023-06-28 LAB — BASIC METABOLIC PANEL
Anion gap: 10 (ref 5–15)
BUN: 18 mg/dL (ref 8–23)
CO2: 24 mmol/L (ref 22–32)
Calcium: 9.3 mg/dL (ref 8.9–10.3)
Chloride: 107 mmol/L (ref 98–111)
Creatinine, Ser: 0.62 mg/dL (ref 0.44–1.00)
GFR, Estimated: >60 mL/min (ref >60)
Glucose, Bld: 112 mg/dL — ABNORMAL HIGH (ref 70–99)
Potassium: 3.7 mmol/L (ref 3.5–5.1)
Sodium: 141 mmol/L (ref 135–145)

## 2023-06-28 LAB — CBC
HCT: 40.3 % (ref 36.0–46.0)
Hemoglobin: 13.4 g/dL (ref 12.0–15.0)
MCH: 29.4 pg (ref 26.0–34.0)
MCHC: 33.3 g/dL (ref 30.0–36.0)
MCV: 88.4 fL (ref 80.0–100.0)
Platelets: 198 10*3/uL (ref 150–400)
RBC: 4.56 MIL/uL (ref 3.87–5.11)
RDW: 12 % (ref 11.5–15.5)
WBC: 6.4 10*3/uL (ref 4.0–10.5)
nRBC: 0 % (ref 0.0–0.2)

## 2023-06-28 LAB — TROPONIN I (HIGH SENSITIVITY)
Troponin I (High Sensitivity): 4 ng/L (ref <18)
Troponin I (High Sensitivity): 3 ng/L (ref <18)

## 2023-06-28 MED ORDER — LIDOCAINE 5 % EX PTCH
1.0000 | MEDICATED_PATCH | Freq: Once | CUTANEOUS | Status: DC
  Administered 2023-06-28: 1 via TRANSDERMAL
  Filled 2023-06-28: qty 1

## 2023-06-28 MED ORDER — ACETAMINOPHEN 325 MG PO TABS
650.0000 mg | ORAL_TABLET | Freq: Once | ORAL | Status: AC
  Administered 2023-06-28: 650 mg via ORAL
  Filled 2023-06-28: qty 2

## 2023-06-28 MED ORDER — ONDANSETRON 4 MG PO TBDP
4.0000 mg | ORAL_TABLET | Freq: Once | ORAL | Status: AC
  Administered 2023-06-28: 4 mg via ORAL
  Filled 2023-06-28: qty 1

## 2023-06-28 MED ORDER — ONDANSETRON HCL 4 MG PO TABS: 4.0000 mg | ORAL_TABLET | Freq: Four times a day (QID) | ORAL | 0 refills | Status: AC

## 2023-06-28 MED ORDER — LIDOCAINE 4 % EX PTCH: 1.0000 | MEDICATED_PATCH | CUTANEOUS | 0 refills | Status: AC

## 2023-06-28 NOTE — Discharge Instructions (Signed)
Please follow-up with your primary doctor.  Please return if develop fevers, chills, worsening chest pain, lightheadedness, passout, palpitations or any new or worsening symptoms that are concerning to you.

## 2023-06-28 NOTE — ED Provider Notes (Signed)
[facility] AT [facility] Provider Note   CSN: [id number] Arrival date & time: 06/28/23  4098     History  Chief Complaint  Patient presents with   Chest Pain    Sandra Morales is a 68 y.o. female.  68 year old female to the emergency department with left-sided chest pain.  Reports that she has got chronic neck pain reports seemingly worsened over the past few days.  Awoke this morning with pain in left chest, and left shoulder area.  Reports she felt some palpitations as well.  Some intermittent nausea.   Chest Pain      Home Medications Prior to Admission medications   Medication Sig Start Date End Date Taking? Authorizing Provider  Vitamin D, Ergocalciferol, (DRISDOL) 1.25 MG (50000 UNIT) CAPS capsule Take 1 capsule (50,000 Units total) by mouth every 7 (seven) days. 10/23/20   Genia Del, MD  Calcium Carb-Cholecalciferol (CALCIUM + VITAMIN D3 PO) Take 1 tablet by mouth daily.    [provider]  losartan (COZAAR) 25 MG tablet Take 25 mg by mouth daily.    [provider]  meclizine (ANTIVERT) 25 MG tablet Take 25 mg by mouth 3 (three) times daily as needed for dizziness.    [provider]  metaxalone (SKELAXIN) 800 MG tablet Take 800 mg by mouth 3 (three) times daily as needed. For muscle tightness     [provider]  metoprolol tartrate (LOPRESSOR) 25 MG tablet Take 25 mg by mouth daily.     [provider]  naproxen sodium (ALEVE) 220 MG tablet Take 220 mg by mouth as needed.    [provider]  traMADol (ULTRAM) 50 MG tablet Take 50 mg by mouth daily as needed.    [provider]      Allergies    Codeine, Flagyl [metronidazole hcl], Penicillins, Prozac [fluoxetine hcl], Sulphadimidine sodium [sulfamethazine sodium], Nabumetone, Omeprazole, Pepcid [famotidine], Protonix [pantoprazole], and Sulfa antibiotics    Review of Systems   Review of Systems  Cardiovascular:   Positive for chest pain.    Physical Exam Updated Vital Signs BP (!) 158/71   Pulse 65   Temp 98.4 F (36.9 C) (Oral)   Resp 16   Ht 5\' 4"  (1.626 m)   Wt 71.7 kg   SpO2 96%   BMI 27.12 kg/m  Physical Exam Vitals and nursing note reviewed.  Constitutional:      General: She is not in acute distress.    Appearance: She is not toxic-appearing.  Cardiovascular:     Rate and Rhythm: Normal rate and regular rhythm.     Heart sounds: Normal heart sounds.  Pulmonary:     Breath sounds: Normal breath sounds.  Chest:     Chest wall: No mass.  Abdominal:     Palpations: Abdomen is soft.  Musculoskeletal:     Right lower leg: No edema.     Left lower leg: No edema.  Skin:    General: Skin is warm.     Capillary Refill: Capillary refill takes less than 2 seconds.  Neurological:     Mental Status: She is alert and oriented to person, place, and time.  Psychiatric:        Mood and Affect: Mood normal.        Behavior: Behavior normal.     ED Results / Procedures / Treatments   Labs (all labs ordered are listed, but only abnormal results are displayed) Labs Reviewed  BASIC METABOLIC PANEL -  Abnormal; Notable for the following components:      Result Value   Glucose, Bld 112 (*)    All other components within normal limits  CBC  TROPONIN I (HIGH SENSITIVITY)  TROPONIN I (HIGH SENSITIVITY)    EKG EKG Interpretation Date/Time:  Monday June 28 2023 04:02:27 EST Ventricular Rate:  72 PR Interval:  160 QRS Duration:  78 QT Interval:  414 QTC Calculation: 453 R Axis:   37  Text Interpretation: Normal sinus rhythm Normal ECG When compared with ECG of 15-Jul-2013 08:40, PREVIOUS ECG IS PRESENT Confirmed by Estanislado Pandy [phone]) on 06/28/2023 7:06:14 AM  Radiology DG Chest 2 View Result Date: 06/28/2023 CLINICAL DATA:  Chest pain and neck pain EXAM: CHEST - 2 VIEW COMPARISON:  09/08/2021 FINDINGS: Artifact from EKG pads. Normal heart size and mediastinal contours. No  acute infiltrate or edema. No effusion or pneumothorax. No acute osseous findings. IMPRESSION: No active cardiopulmonary disease. Electronically Signed   By: Tiburcio Pea M.D.   On: 06/28/2023 05:05    Procedures Procedures    Medications Ordered in ED Medications  lidocaine (LIDODERM) 5 % 1 patch (1 patch Transdermal Patch Applied 06/28/23 0805)  ondansetron (ZOFRAN-ODT) disintegrating tablet 4 mg (4 mg Oral Given 06/28/23 0806)  acetaminophen (TYLENOL) tablet 650 mg (650 mg Oral Given 06/28/23 0806)    ED Course/ Medical Decision Making/ A&P Clinical Course as of 06/28/23 0854  Mon Jun 28, 2023  0703 CT coronoary 08/20/22: "IMPRESSION: 1. Trace pericardial effusion or thickening which is stable. 2. Numerous pleural-based subcentimeter nodules are unchanged. 3. There were otherwise no significant extracardiac incidental findings identified " [TY]  0703 Troponin I (High Sensitivity): 4 Reassuring [TY]  0703 CBC No leukocytosis to suggest infection.  No anemia [TY]  0704 Basic metabolic panel(!) No significant metabolic derangement.  Normal kidney function. [TY]  [id number] DG Chest 2 View IMPRESSION: No active cardiopulmonary disease.   Independently interpreted x-rays, agree with radiology read.  No acute pathology [TY]    Clinical Course User Index [TY] Coral Spikes, DO                                 Medical Decision Making 68 year old female presenting emergency department for left-sided chest pain also having some left neck pain.  Neck pain appears chronic and has documented radicular.  Husband notes patient with long history of MSK type complaints.  She is afebrile vital signs reassuring.  EKG without ST segment changes to indicate ischemia on my independent interpretation.  Troponin negative x 2.  ACS less likely.  Considered PE, however Wells/PERC negative. No significant abnormalities on workup as noted in ED course.  Had CT coronary last year which was largely reassuring.   Was given lidocaine patch and Tylenol here for suspected MSK etiology.  Discussed follow-up with primary doctors, agreeable plan.  Stable for discharge at this time.  Amount and/or Complexity of Data Reviewed Labs: ordered. Decision-making details documented in ED Course. Radiology: ordered. Decision-making details documented in ED Course.  Risk OTC drugs. Prescription drug management.          Final Clinical Impression(s) / ED Diagnoses Final diagnoses:  None    Rx / DC Orders ED Discharge Orders     None         Coral Spikes, DO 06/28/23 [phone]

## 2023-06-28 NOTE — ED Triage Notes (Signed)
Pt has been having neck pain 3-4 days ago. Pt states yesterday it started going up into her jaw and down into her chest and back accompanied with nausea

## 2023-11-18 ENCOUNTER — Other Ambulatory Visit: Payer: Self-pay | Admitting: Family Medicine

## 2023-11-18 DIAGNOSIS — R1900 Intra-abdominal and pelvic swelling, mass and lump, unspecified site: Secondary | ICD-10-CM

## 2023-11-19 ENCOUNTER — Other Ambulatory Visit

## 2023-11-19 ENCOUNTER — Ambulatory Visit
Admission: RE | Admit: 2023-11-19 | Discharge: 2023-11-19 | Disposition: A | Discharge status: Home / Self Care | Source: Ambulatory Visit | Attending: Family Medicine | Admitting: Family Medicine

## 2023-11-19 DIAGNOSIS — R1900 Intra-abdominal and pelvic swelling, mass and lump, unspecified site: Secondary | ICD-10-CM

## 2023-11-22 ENCOUNTER — Other Ambulatory Visit
